# Patient Record
Sex: Female | Born: 1935
Health system: Southern US, Community
[De-identification: ages and names within clinical notes are randomized; demographics above are authoritative.]

## PROBLEM LIST (undated history)

## (undated) DIAGNOSIS — H409 Unspecified glaucoma: Secondary | ICD-10-CM

## (undated) DIAGNOSIS — I10 Essential (primary) hypertension: Secondary | ICD-10-CM

## (undated) DIAGNOSIS — H1013 Acute atopic conjunctivitis, bilateral: Secondary | ICD-10-CM

## (undated) DIAGNOSIS — M8580 Other specified disorders of bone density and structure, unspecified site: Secondary | ICD-10-CM

## (undated) DIAGNOSIS — E559 Vitamin D deficiency, unspecified: Secondary | ICD-10-CM

## (undated) DIAGNOSIS — K635 Polyp of colon: Secondary | ICD-10-CM

## (undated) DIAGNOSIS — J309 Allergic rhinitis, unspecified: Secondary | ICD-10-CM

## (undated) DIAGNOSIS — E785 Hyperlipidemia, unspecified: Secondary | ICD-10-CM

## (undated) HISTORY — DX: Essential (primary) hypertension: I10

## (undated) HISTORY — DX: Other specified disorders of bone density and structure, unspecified site: M85.80

## (undated) HISTORY — DX: Vitamin D deficiency, unspecified: E55.9

## (undated) HISTORY — DX: Hyperlipidemia, unspecified: E78.5

## (undated) HISTORY — DX: Acute atopic conjunctivitis, bilateral: H10.13

## (undated) HISTORY — DX: Polyp of colon: K63.5

## (undated) HISTORY — DX: Allergic rhinitis, unspecified: J30.9

## (undated) HISTORY — PX: FOOT SURGERY: SHX648

## (undated) HISTORY — DX: Unspecified glaucoma: H40.9

---

## 2000-08-27 ENCOUNTER — Other Ambulatory Visit: Admission: RE | Admit: 2000-08-27 | Discharge: 2000-08-27 | Payer: Self-pay | Admitting: General Surgery

## 2002-01-14 ENCOUNTER — Encounter (INDEPENDENT_AMBULATORY_CARE_PROVIDER_SITE_OTHER): Payer: Self-pay

## 2002-01-14 ENCOUNTER — Ambulatory Visit (HOSPITAL_COMMUNITY): Admission: RE | Admit: 2002-01-14 | Discharge: 2002-01-14 | Payer: Self-pay | Admitting: Gastroenterology

## 2002-11-12 ENCOUNTER — Other Ambulatory Visit: Admission: RE | Admit: 2002-11-12 | Discharge: 2002-11-12 | Payer: Self-pay | Admitting: *Deleted

## 2004-11-21 ENCOUNTER — Other Ambulatory Visit: Admission: RE | Admit: 2004-11-21 | Discharge: 2004-11-21 | Payer: Self-pay | Admitting: *Deleted

## 2005-11-13 HISTORY — PX: CATARACT EXTRACTION: SUR2

## 2006-07-03 ENCOUNTER — Ambulatory Visit (HOSPITAL_COMMUNITY): Admission: RE | Admit: 2006-07-03 | Discharge: 2006-07-03 | Payer: Self-pay | Admitting: Gastroenterology

## 2006-07-12 ENCOUNTER — Ambulatory Visit (HOSPITAL_COMMUNITY): Admission: RE | Admit: 2006-07-12 | Discharge: 2006-07-13 | Payer: Self-pay | Admitting: Ophthalmology

## 2006-07-12 ENCOUNTER — Encounter (INDEPENDENT_AMBULATORY_CARE_PROVIDER_SITE_OTHER): Payer: Self-pay | Admitting: *Deleted

## 2006-12-18 ENCOUNTER — Other Ambulatory Visit: Admission: RE | Admit: 2006-12-18 | Discharge: 2006-12-18 | Payer: Self-pay | Admitting: *Deleted

## 2009-12-30 ENCOUNTER — Other Ambulatory Visit: Admission: RE | Admit: 2009-12-30 | Discharge: 2009-12-30 | Payer: Self-pay | Admitting: Family Medicine

## 2011-03-31 NOTE — Op Note (Signed)
NAMEETNA, FORQUER NO.:  [id number]   MEDICAL RECORD NO.:  [id number]          PATIENT TYPE:  AMB   LOCATION:  ENDO                         FACILITY:  [facility]   PHYSICIAN:  Petra Kuba, M.D.    DATE OF BIRTH:  03/16/1933   DATE OF PROCEDURE:  07/03/2006  DATE OF DISCHARGE:                                 OPERATIVE REPORT   PROCEDURE:  Colonoscopy.   INDICATIONS:  History of colon polyps, due for colonic screening.  Consent  was signed after risks, benefits, and  options thoroughly discussed multiple  times in the past.   MEDICINES USED:  Fentanyl 50 mcg, Versed 5 mg.   PROCEDURE:  Rectal inspection is pertinent for external hemorrhoids, small.  Digital exam was negative.  Video pediatric adjustable colonoscope was  inserted and, despite a long looping tortuous colon, with lots of advancing  and withdrawals, we are able to easily advance around the colon to the  cecum.  This did not require any abdominal pressure, any position changes.  No abnormality was seen on insertion.  Cecum is identified by the  appendiceal orifice and ileocecal valve.  The scope was slowly withdrawn.  The prep was adequate.  There was minimal liquid stool that required washing  and suctioning.  On slow withdrawal through the colon, no abnormalities were  seen, specifically no polyps, tumors, masses or diverticula.  Once back in  the rectum, anorectal pull through and retroflexion confirms some small  hemorrhoids.  Scope was straightened and readvanced to the left side of the  colon.  Air was suctioned, scope removed.  The patient tolerated the  procedure well.  There was no obvious immediate complication.   ENDOSCOPIC DIAGNOSES:  1. Internal and external small hemorrhoids.  2. Otherwise within normal limits to the cecum.   PLAN:  Happy to see back p.r.n.  Repeat screening in 5 years if doing well  medically.  Return care to Dr. Janey Greaser.            ______________________________  Petra Kuba, M.D.     MEM/MEDQ  D:  07/03/2006  T:  07/03/2006  Job:  [id number]   cc:   Al Decant. Janey Greaser, MD

## 2011-03-31 NOTE — Op Note (Signed)
Brittany Burgess, Brittany Burgess NO.:  [id number]   MEDICAL RECORD NO.:  [id number]          PATIENT TYPE:  OIB   LOCATION:  5730                         FACILITY:  [facility]   PHYSICIAN:  Beulah Gandy. Ashley Royalty, M.D. DATE OF BIRTH:  [birth year]   DATE OF PROCEDURE:  07/12/2006  DATE OF DISCHARGE:  07/13/2006                                 OPERATIVE REPORT   ADMISSION DIAGNOSES:  1. Retained lens material.  2. Phacolytic glaucoma.  3. Dislocated intraocular lens in the right eye.   PROCEDURES:  Pars plana vitrectomy with 25-gauge system, pan retinal  photocoagulation, removal of intraocular lens from vitreous right eye,  placement of secondary intraocular lens right eye, gas fluid exchange,  membrane peel.   SURGEON:  Alan Mulder, M.D.   ASSISTANT:  Rosalie Doctor, MA.   ANESTHESIA:  General.   DETAILS:  Usual prep and drape, conjunctival peritomy from 8 o'clock around  to 4 o'clock.  The cornea was cloudy and almost white this point.  Scleral  flaps were raised at 3 and 9 o'clock in anticipation of IOL suture.  Corneoscleral wound was created from 10 o'clock to 2 o'clock 7 mm in length  and three layer shape.  The 25-gauge trocars were placed at 8, 10 and 2  o'clock.  Infusion port was inserted at 8 o'clock.  The pressure was brought  down to 20 mmHg.  The cornea began to clear at this point.  Provisc was  placed on the corneal surface and the BIOM viewing system was moved into  place.  Pars plana vitrectomy was performed removing large quantities of  vitreous and the lens material from the vitreous cavity.  Small chunks and  large chunks were also seen, yellow femoral flakes of nucleus were  encountered and carefully removed under low suction and rapid cutting.  Scleral depression was used to gain access to the vitreous base where  additional clumps of white fluffy material were encountered and removed.  Membranes membranes were stripped from the periphery where lens  material was  entangled and ensnared in the membranes.  The corneoscleral wound was opened  and the cutter was placed in the anterior chamber removing all blood and  lens material from the anterior chamber area.  The intraocular lens which  was partially in the vitreous was grasped with 25-gauge poor forceps and  passed through the pupil into the anterior chamber and out through the  corneoscleral wound.  Two Prolene 10-0 nylon sutures were placed from 3  o'clock to 9 o'clock beneath the scleral flaps.  The sutures were  externalized.  The new intraocular lens made by [employer]  model CZ70BD, power 10 D, length 12.5 mm, optic 7.0 mm, serial number  [id number] was brought onto the field.  It was inspected and cleaned.  The  sutures were attached to the eyelets of the lens.  The lens was passed  through the corneoscleral wound through the pupil and into the posterior  chamber into the ciliary sulcus.  The lens was dialed into place and the  Prolene sutures were drawn securely.  They  were knotted beneath the scleral  flaps and the flaps were pressed down over the knots.  The corneal wound was  closed with seven interrupted 10-0 nylon sutures.  The wound was tested and  found to be tight.  Additional vitrectomy was carried out at this point,  removing blood from the vitreous cavity as well as additional lens  fragments.  A partial gas fluid exchange was performed which freed  additional lens fragments into the vitreous.  The left vitreous cavity was  filled again with fluid and each lens remnant was removed.  A 30% gas fill  was performed.  The instruments were removed from the eye.  The 25-gauge  trocars were removed.  The wounds were tested and were held until tight.  The conjunctiva was reposited with 7-0 chromic suture.  Polymyxin and  gentamicin were irrigated into tenon space.  Marcaine was injected around  the globe for postop pain.  TobraDex ophthalmic ointment was  placed.  Closing pressure was 10 with a Barraquer tonometer.  The TobraDex ophthalmic  ointment, patch and shield were placed.   The patient was taken to recovery in satisfactory condition.   COMPLICATIONS:  None.   OPERATIVE TIME:  One and a half hours.      Beulah Gandy. Ashley Royalty, M.D.  Electronically Signed     JDM/MEDQ  D:  07/12/2006  T:  07/13/2006  Job:  [id number]

## 2013-12-19 ENCOUNTER — Ambulatory Visit: Payer: Self-pay | Admitting: Podiatrist

## 2014-02-04 ENCOUNTER — Ambulatory Visit: Payer: Self-pay | Admitting: Internal Medicine

## 2014-03-03 ENCOUNTER — Encounter: Payer: Self-pay | Admitting: Internal Medicine

## 2014-03-03 ENCOUNTER — Other Ambulatory Visit (INDEPENDENT_AMBULATORY_CARE_PROVIDER_SITE_OTHER): Payer: Managed Care, Other (non HMO)

## 2014-03-03 ENCOUNTER — Ambulatory Visit (INDEPENDENT_AMBULATORY_CARE_PROVIDER_SITE_OTHER): Payer: Managed Care, Other (non HMO) | Admitting: Internal Medicine

## 2014-03-03 VITALS — BP 138/88 | HR 58 | Temp 97.6°F | Ht 62.0 in | Wt 125.0 lb

## 2014-03-03 DIAGNOSIS — M8580 Other specified disorders of bone density and structure, unspecified site: Secondary | ICD-10-CM | POA: Insufficient documentation

## 2014-03-03 DIAGNOSIS — E559 Vitamin D deficiency, unspecified: Secondary | ICD-10-CM | POA: Insufficient documentation

## 2014-03-03 DIAGNOSIS — H409 Unspecified glaucoma: Secondary | ICD-10-CM | POA: Insufficient documentation

## 2014-03-03 DIAGNOSIS — I10 Essential (primary) hypertension: Secondary | ICD-10-CM | POA: Insufficient documentation

## 2014-03-03 DIAGNOSIS — K635 Polyp of colon: Secondary | ICD-10-CM | POA: Insufficient documentation

## 2014-03-03 DIAGNOSIS — Z0001 Encounter for general adult medical examination with abnormal findings: Secondary | ICD-10-CM | POA: Insufficient documentation

## 2014-03-03 DIAGNOSIS — J309 Allergic rhinitis, unspecified: Secondary | ICD-10-CM | POA: Insufficient documentation

## 2014-03-03 DIAGNOSIS — Z0000 Encounter for general adult medical examination without abnormal findings: Secondary | ICD-10-CM

## 2014-03-03 LAB — BASIC METABOLIC PANEL
BUN: 17 mg/dL (ref 6–23)
CALCIUM: 9.8 mg/dL (ref 8.4–10.5)
CO2: 28 meq/L (ref 19–32)
CREATININE: 0.6 mg/dL (ref 0.4–1.2)
Chloride: 107 mEq/L (ref 96–112)
GFR: 98.71 mL/min (ref >60.00)
GLUCOSE: 85 mg/dL (ref 70–99)
Potassium: 3.9 mEq/L (ref 3.5–5.1)
SODIUM: 142 meq/L (ref 135–145)

## 2014-03-03 LAB — HEPATIC FUNCTION PANEL
ALT: 15 U/L (ref 0–35)
AST: 20 U/L (ref 0–37)
Albumin: 4.3 g/dL (ref 3.5–5.2)
Alkaline Phosphatase: 50 U/L (ref 39–117)
BILIRUBIN DIRECT: 0 mg/dL (ref 0.0–0.3)
BILIRUBIN TOTAL: 0.7 mg/dL (ref 0.3–1.2)
TOTAL PROTEIN: 7.4 g/dL (ref 6.0–8.3)

## 2014-03-03 LAB — URINALYSIS, ROUTINE W REFLEX MICROSCOPIC
BILIRUBIN URINE: NEGATIVE
HGB URINE DIPSTICK: NEGATIVE
KETONES UR: NEGATIVE
Leukocytes, UA: NEGATIVE
Nitrite: NEGATIVE
PH: 6 (ref 5.0–8.0)
Specific Gravity, Urine: 1.02 (ref 1.000–1.030)
Total Protein, Urine: NEGATIVE
URINE GLUCOSE: NEGATIVE
UROBILINOGEN UA: 0.2 (ref 0.0–1.0)

## 2014-03-03 LAB — LIPID PANEL
Cholesterol: 139 mg/dL (ref 0–200)
HDL: 57.3 mg/dL (ref >39.00)
LDL Cholesterol: 65 mg/dL (ref 0–99)
Total CHOL/HDL Ratio: 2
Triglycerides: 85 mg/dL (ref 0.0–149.0)
VLDL: 17 mg/dL (ref 0.0–40.0)

## 2014-03-03 LAB — CBC WITH DIFFERENTIAL/PLATELET
BASOS ABS: 0 10*3/uL (ref 0.0–0.1)
Basophils Relative: 0.4 % (ref 0.0–3.0)
EOS ABS: 0.1 10*3/uL (ref 0.0–0.7)
EOS PCT: 1.6 % (ref 0.0–5.0)
HCT: 42.7 % (ref 36.0–46.0)
HEMOGLOBIN: 14.2 g/dL (ref 12.0–15.0)
Lymphocytes Relative: 41.1 % (ref 12.0–46.0)
Lymphs Abs: 2.8 10*3/uL (ref 0.7–4.0)
MCHC: 33.3 g/dL (ref 30.0–36.0)
MCV: 91.3 fl (ref 78.0–100.0)
MONOS PCT: 8.5 % (ref 3.0–12.0)
Monocytes Absolute: 0.6 10*3/uL (ref 0.1–1.0)
NEUTROS ABS: 3.3 10*3/uL (ref 1.4–7.7)
Neutrophils Relative %: 48.4 % (ref 43.0–77.0)
PLATELETS: 251 10*3/uL (ref 150.0–400.0)
RBC: 4.68 Mil/uL (ref 3.87–5.11)
RDW: 13.1 % (ref 11.5–14.6)
WBC: 6.9 10*3/uL (ref 4.5–10.5)

## 2014-03-03 LAB — TSH: TSH: 2.54 u[IU]/mL (ref 0.35–5.50)

## 2014-03-03 NOTE — Assessment & Plan Note (Signed)

## 2014-03-03 NOTE — Assessment & Plan Note (Signed)
stable overall by history and exam, recent data reviewed with pt, and pt to continue medical treatment as before,  to f/u any worsening symptoms or concerns BP Readings from Last 3 Encounters:  03/03/14 138/88

## 2014-03-03 NOTE — Progress Notes (Signed)
Subjective:    Patient ID: Brittany Burgess, female    DOB: 28-Jun-1933, 80 y.o.   MRN: [id number]  HPI  Here for wellness and f/u;  Overall doing ok;  Pt denies CP, worsening SOB, DOE, wheezing, orthopnea, PND, worsening LE edema, palpitations, dizziness or syncope.  Pt denies neurological change such as new headache, facial or extremity weakness.  Pt denies polydipsia, polyuria, or low sugar symptoms. Pt states overall good compliance with treatment and medications, good tolerability, and has been trying to follow lower cholesterol diet.  Pt denies worsening depressive symptoms, suicidal ideation or panic. No fever, night sweats, wt loss, loss of appetite, or other constitutional symptoms.  Pt states good ability with ADL's, has low fall risk, home safety reviewed and adequate, no other significant changes in hearing or vision, and only occasionally active with exercise. Due for mammogram.  No current complaints Past Medical History  Diagnosis Date  . Glaucoma   . Hypertension   . Colon polyps   . Allergic rhinitis   . Allergic conjunctivitis of both eyes   . Osteopenia   . Vitamin D deficiency    Past Surgical History  Procedure Laterality Date  . Foot surgery    . Cataract extraction  2007    reports that she has never smoked. She has never used smokeless tobacco. She reports that she does not drink alcohol or use illicit drugs. family history includes Cancer in her father and mother; Diabetes in her father and mother. Allergies  Allergen Reactions  . Combigan [Brimonidine Tartrate-Timolol]     Red eyelids    No current outpatient prescriptions on file prior to visit.   No current facility-administered medications on file prior to visit.     Review of Systems Constitutional: Negative for diaphoresis, activity change, appetite change or unexpected weight change.  HENT: Negative for hearing loss, ear pain, facial swelling, mouth sores and neck stiffness.   Eyes: Negative for  pain, redness and visual disturbance.  Respiratory: Negative for shortness of breath and wheezing.   Cardiovascular: Negative for chest pain and palpitations.  Gastrointestinal: Negative for diarrhea, blood in stool, abdominal distention or other pain Genitourinary: Negative for hematuria, flank pain or change in urine volume.  Musculoskeletal: Negative for myalgias and joint swelling.  Skin: Negative for color change and wound.  Neurological: Negative for syncope and numbness. other than noted Hematological: Negative for adenopathy.  Psychiatric/Behavioral: Negative for hallucinations, self-injury, decreased concentration and agitation.      Objective:   Physical Exam BP 138/88  Pulse 58  Temp(Src) 97.6 F (36.4 C) (Oral)  Ht 5\' 2"  (1.575 m)  Wt 125 lb (56.7 kg)  BMI 22.86 kg/m2  SpO2 98% VS noted,  Constitutional: Pt is oriented to person, place, and time. Appears well-developed and well-nourished but on thin side.  Head: Normocephalic and atraumatic.  Right Ear: External ear normal.  Left Ear: External ear normal.  Nose: Nose normal.  Mouth/Throat: Oropharynx is clear and moist.  Eyes: Conjunctivae and EOM are normal. Pupils are equal, round, and reactive to light.  Neck: Normal range of motion. Neck supple. No JVD present. No tracheal deviation present.  Cardiovascular: Normal rate, regular rhythm, normal heart sounds and intact distal pulses.   Pulmonary/Chest: Effort normal and breath sounds normal.  Abdominal: Soft. Bowel sounds are normal. There is no tenderness. No HSM  Musculoskeletal: Normal range of motion. Exhibits no edema.  Lymphadenopathy:  Has no cervical adenopathy.  Neurological: Pt is alert and oriented  to person, place, and time. Pt has normal reflexes. No cranial nerve deficit.  Skin: Skin is warm and dry. No rash noted.  Psychiatric:  Has  normal mood and affect. Behavior is normal.     Assessment & Plan:

## 2014-03-03 NOTE — Patient Instructions (Addendum)
Please continue all other medications as before, and refills have been done if requested. Please have the pharmacy call with any other refills you may need.  Please continue your efforts at being more active, low cholesterol diet, and weight control. You are otherwise up to date with prevention measures today.  Please keep your appointments with your specialists as you may have planned  You will be contacted regarding the referral for: mammogram  You can call for a Nurse Visit to have the Prevnar done at any time  Please go to the LAB in the Basement (turn left off the elevator) for the tests to be done today You will be contacted by phone if any changes need to be made immediately.  Otherwise, you will receive a letter about your results with an explanation, but please check with MyChart first.  Please remember to sign up for MyChart if you have not done so, as this will be important to you in the future with finding out test results, communicating by private email, and scheduling acute appointments online when needed.  Please return in 1 year for your yearly visit, or sooner if needed, with Lab testing done 3-5 days before

## 2014-03-04 ENCOUNTER — Telehealth: Payer: Self-pay | Admitting: Internal Medicine

## 2014-03-04 NOTE — Telephone Encounter (Signed)
Relevant patient education mailed to patient.  

## 2014-07-17 ENCOUNTER — Other Ambulatory Visit: Payer: Self-pay | Admitting: Internal Medicine

## 2014-09-28 LAB — HM MAMMOGRAPHY

## 2014-10-06 ENCOUNTER — Encounter: Payer: Self-pay | Admitting: Internal Medicine

## 2015-02-02 ENCOUNTER — Encounter: Payer: Self-pay | Admitting: Internal Medicine

## 2015-02-02 ENCOUNTER — Ambulatory Visit (INDEPENDENT_AMBULATORY_CARE_PROVIDER_SITE_OTHER): Payer: Medicare HMO | Admitting: Internal Medicine

## 2015-02-02 ENCOUNTER — Other Ambulatory Visit (INDEPENDENT_AMBULATORY_CARE_PROVIDER_SITE_OTHER): Payer: Medicare HMO

## 2015-02-02 VITALS — BP 128/86 | HR 80 | Temp 98.1°F | Resp 18 | Ht 62.0 in | Wt 124.1 lb

## 2015-02-02 DIAGNOSIS — M79609 Pain in unspecified limb: Secondary | ICD-10-CM | POA: Insufficient documentation

## 2015-02-02 DIAGNOSIS — I10 Essential (primary) hypertension: Secondary | ICD-10-CM

## 2015-02-02 DIAGNOSIS — R202 Paresthesia of skin: Secondary | ICD-10-CM

## 2015-02-02 DIAGNOSIS — Z0000 Encounter for general adult medical examination without abnormal findings: Secondary | ICD-10-CM | POA: Diagnosis not present

## 2015-02-02 LAB — URINALYSIS, ROUTINE W REFLEX MICROSCOPIC
Bilirubin Urine: NEGATIVE
Ketones, ur: NEGATIVE
Leukocytes, UA: NEGATIVE
Nitrite: NEGATIVE
Specific Gravity, Urine: 1.02 (ref 1.000–1.030)
Total Protein, Urine: NEGATIVE
UROBILINOGEN UA: 0.2 (ref 0.0–1.0)
Urine Glucose: NEGATIVE
pH: 6 (ref 5.0–8.0)

## 2015-02-02 LAB — CBC WITH DIFFERENTIAL/PLATELET
Basophils Absolute: 0 10*3/uL (ref 0.0–0.1)
Basophils Relative: 0.4 % (ref 0.0–3.0)
EOS PCT: 1.3 % (ref 0.0–5.0)
Eosinophils Absolute: 0.1 10*3/uL (ref 0.0–0.7)
HEMATOCRIT: 41.3 % (ref 36.0–46.0)
HEMOGLOBIN: 13.7 g/dL (ref 12.0–15.0)
LYMPHS ABS: 2.2 10*3/uL (ref 0.7–4.0)
Lymphocytes Relative: 37.1 % (ref 12.0–46.0)
MCHC: 33.3 g/dL (ref 30.0–36.0)
MCV: 90.2 fl (ref 78.0–100.0)
MONOS PCT: 9.7 % (ref 3.0–12.0)
Monocytes Absolute: 0.6 10*3/uL (ref 0.1–1.0)
NEUTROS ABS: 3 10*3/uL (ref 1.4–7.7)
Neutrophils Relative %: 51.5 % (ref 43.0–77.0)
Platelets: 253 10*3/uL (ref 150.0–400.0)
RBC: 4.58 Mil/uL (ref 3.87–5.11)
RDW: 13.6 % (ref 11.5–15.5)
WBC: 5.9 10*3/uL (ref 4.0–10.5)

## 2015-02-02 LAB — BASIC METABOLIC PANEL
BUN: 12 mg/dL (ref 6–23)
CHLORIDE: 104 meq/L (ref 96–112)
CO2: 31 mEq/L (ref 19–32)
CREATININE: 0.64 mg/dL (ref 0.40–1.20)
Calcium: 9.6 mg/dL (ref 8.4–10.5)
GFR: 94.94 mL/min (ref >60.00)
Glucose, Bld: 91 mg/dL (ref 70–99)
POTASSIUM: 4.3 meq/L (ref 3.5–5.1)
Sodium: 140 mEq/L (ref 135–145)

## 2015-02-02 LAB — HEPATIC FUNCTION PANEL
ALT: 13 U/L (ref 0–35)
AST: 20 U/L (ref 0–37)
Albumin: 4.2 g/dL (ref 3.5–5.2)
Alkaline Phosphatase: 51 U/L (ref 39–117)
BILIRUBIN TOTAL: 0.4 mg/dL (ref 0.2–1.2)
Bilirubin, Direct: 0.1 mg/dL (ref 0.0–0.3)
Total Protein: 7.1 g/dL (ref 6.0–8.3)

## 2015-02-02 LAB — LIPID PANEL
CHOLESTEROL: 146 mg/dL (ref 0–200)
HDL: 56.5 mg/dL (ref >39.00)
LDL CALC: 64 mg/dL (ref 0–99)
NonHDL: 89.5
Total CHOL/HDL Ratio: 3
Triglycerides: 130 mg/dL (ref 0.0–149.0)
VLDL: 26 mg/dL (ref 0.0–40.0)

## 2015-02-02 LAB — TSH: TSH: 3.14 u[IU]/mL (ref 0.35–4.50)

## 2015-02-02 NOTE — Assessment & Plan Note (Signed)
C/w prob mild right CTS, for right wrist splint qhs prn, tylenol prnb, .followk

## 2015-02-02 NOTE — Progress Notes (Signed)
Subjective:    Patient ID: Brittany Burgess, female    DOB: [birth year], 81 y.o.   MRN: [id number]  HPI  Here for wellness and f/u;  Overall doing ok;  Pt denies Chest pain, worsening SOB, DOE, wheezing, orthopnea, PND, worsening LE edema, palpitations, dizziness or syncope.  Pt denies neurological change such as new headache, facial or extremity weakness.  Pt denies polydipsia, polyuria, or low sugar symptoms. Pt states overall good compliance with treatment and medications, good tolerability, and has been trying to follow appropriate diet.  Pt denies worsening depressive symptoms, suicidal ideation or panic. No fever, night sweats, wt loss, loss of appetite, or other constitutional symptoms.  Pt states good ability with ADL's, has low fall risk, home safety reviewed and adequate, no other significant changes in hearing or vision, and only occasionally active with exercise. Decliens pna shot, thinks she had this previously, details not clear. C/o mild intermittent right wrist discomfort and funnys numb/tingling sensation to medial right thumb and first finger Past Medical History  Diagnosis Date  . Glaucoma   . Hypertension   . Colon polyps   . Allergic rhinitis   . Allergic conjunctivitis of both eyes   . Osteopenia   . Vitamin D deficiency    Past Surgical History  Procedure Laterality Date  . Foot surgery    . Cataract extraction  2007    reports that she has never smoked. She has never used smokeless tobacco. She reports that she does not drink alcohol or use illicit drugs. family history includes Cancer in her father and mother; Diabetes in her father and mother. Allergies  Allergen Reactions  . Combigan [Brimonidine Tartrate-Timolol]     Red eyelids    Current Outpatient Prescriptions on File Prior to Visit  Medication Sig Dispense Refill  . lovastatin (MEVACOR) 40 MG tablet TAKE 1 TABLET ONCE A DAY BY MOUTH 90 tablet 3  . Multiple Vitamins-Minerals (CENTRUM SILVER PO) Take 1  tablet by mouth daily.    . travoprost, benzalkonium, (TRAVATAN) 0.004 % ophthalmic solution 1 drop at bedtime.    Marland Kitchen aspirin 81 MG tablet Take 81 mg by mouth daily.    . Brinzolamide-Brimonidine [facility] OP) Apply to eye 3 (three) times daily.    . Carboxymethylcellul-Glycerin (OPTIVE) 0.5-0.9 % SOLN Apply to eye as needed.     No current facility-administered medications on file prior to visit.   Review of Systems Constitutional: Negative for increased diaphoresis, other activity, appetite or siginficant weight change other than noted HENT: Negative for worsening hearing loss, ear pain, facial swelling, mouth sores and neck stiffness.   Eyes: Negative for other worsening pain, redness or visual disturbance.  Respiratory: Negative for shortness of breath and wheezing  Cardiovascular: Negative for chest pain and palpitations.  Gastrointestinal: Negative for diarrhea, blood in stool, abdominal distention or other pain Genitourinary: Negative for hematuria, flank pain or change in urine volume.  Musculoskeletal: Negative for myalgias or other joint complaints.  Skin: Negative for color change and wound or drainage.  Neurological: Negative for syncope and numbness. other than noted Hematological: Negative for adenopathy. or other swelling Psychiatric/Behavioral: Negative for hallucinations, SI, self-injury, decreased concentration or other worsening agitation.      Objective:   Physical Exam BP 128/86 mmHg  Pulse 80  Temp(Src) 98.1 F (36.7 C) (Oral)  Resp 18  Ht 5\' 2"  (1.575 m)  Wt 124 lb 1.9 oz (56.3 kg)  BMI 22.70 kg/m2  SpO2 99% VS noted,  Constitutional: Pt  is oriented to person, place, and time. Appears well-developed and well-nourished, in no significant distress Head: Normocephalic and atraumatic.  Right Ear: External ear normal.  Left Ear: External ear normal.  Nose: Nose normal.  Mouth/Throat: Oropharynx is clear and moist.  Eyes: Conjunctivae and EOM are normal.  Pupils are equal, round, and reactive to light.  Neck: Normal range of motion. Neck supple. No JVD present. No tracheal deviation present or significant neck LA or mass Cardiovascular: Normal rate, regular rhythm, normal heart sounds and intact distal pulses.   Pulmonary/Chest: Effort normal and breath sounds without rales or wheezing  Abdominal: Soft. Bowel sounds are normal. NT. No HSM  Musculoskeletal: Normal range of motion. Exhibits no edema.  Lymphadenopathy:  Has no cervical adenopathy.  Neurological: Pt is alert and oriented to person, place, and time. Pt has normal reflexes. No cranial nerve deficit. Motor grossly intact Skin: Skin is warm and dry. No rash noted.  Psychiatric:  Has normal mood and affect. Behavior is normal.      Assessment & Plan:

## 2015-02-02 NOTE — Progress Notes (Signed)
Pre visit review using our clinic review tool, if applicable. No additional management support is needed unless otherwise documented below in the visit note. 

## 2015-02-02 NOTE — Patient Instructions (Signed)
Please wear a right wrist splint at night to help the right thumb and wrist symptoms until improved  Please continue all other medications as before, and refills have been done if requested.  Please have the pharmacy call with any other refills you may need.  Please continue your efforts at being more active, low cholesterol diet, and weight control.  You are otherwise up to date with prevention measures today.  Please keep your appointments with your specialists as you may have planned  Please go to the LAB in the Basement (turn left off the elevator) for the tests to be done today  You will be contacted by phone if any changes need to be made immediately.  Otherwise, you will receive a letter about your results with an explanation, but please check with MyChart first.  Please remember to sign up for MyChart if you have not done so, as this will be important to you in the future with finding out test results, communicating by private email, and scheduling acute appointments online when needed.  Please return in 1 year for your yearly visit, or sooner if needed, with Lab testing done 3-5 days before

## 2015-02-02 NOTE — Assessment & Plan Note (Signed)

## 2015-07-16 ENCOUNTER — Other Ambulatory Visit: Payer: Self-pay | Admitting: Internal Medicine

## 2015-09-15 DIAGNOSIS — Z23 Encounter for immunization: Secondary | ICD-10-CM | POA: Diagnosis not present

## 2015-10-12 DIAGNOSIS — Z803 Family history of malignant neoplasm of breast: Secondary | ICD-10-CM | POA: Diagnosis not present

## 2015-10-12 DIAGNOSIS — Z1231 Encounter for screening mammogram for malignant neoplasm of breast: Secondary | ICD-10-CM | POA: Diagnosis not present

## 2015-10-12 LAB — HM MAMMOGRAPHY

## 2015-10-15 ENCOUNTER — Encounter: Payer: Self-pay | Admitting: Internal Medicine

## 2015-10-15 ENCOUNTER — Other Ambulatory Visit: Payer: Self-pay | Admitting: Internal Medicine

## 2015-10-28 DIAGNOSIS — H40013 Open angle with borderline findings, low risk, bilateral: Secondary | ICD-10-CM | POA: Diagnosis not present

## 2015-12-08 ENCOUNTER — Other Ambulatory Visit (INDEPENDENT_AMBULATORY_CARE_PROVIDER_SITE_OTHER): Payer: Medicare HMO

## 2015-12-08 ENCOUNTER — Encounter: Payer: Self-pay | Admitting: Internal Medicine

## 2015-12-08 ENCOUNTER — Ambulatory Visit (INDEPENDENT_AMBULATORY_CARE_PROVIDER_SITE_OTHER): Payer: Medicare HMO | Admitting: Internal Medicine

## 2015-12-08 ENCOUNTER — Ambulatory Visit (INDEPENDENT_AMBULATORY_CARE_PROVIDER_SITE_OTHER)
Admission: RE | Admit: 2015-12-08 | Discharge: 2015-12-08 | Disposition: A | Discharge status: Home / Self Care | Payer: Medicare HMO | Source: Ambulatory Visit | Attending: Internal Medicine | Admitting: Internal Medicine

## 2015-12-08 VITALS — BP 120/74 | HR 75 | Temp 98.3°F | Resp 20 | Wt 125.2 lb

## 2015-12-08 DIAGNOSIS — Z23 Encounter for immunization: Secondary | ICD-10-CM | POA: Diagnosis not present

## 2015-12-08 DIAGNOSIS — M545 Low back pain, unspecified: Secondary | ICD-10-CM | POA: Insufficient documentation

## 2015-12-08 DIAGNOSIS — I10 Essential (primary) hypertension: Secondary | ICD-10-CM

## 2015-12-08 DIAGNOSIS — Z0000 Encounter for general adult medical examination without abnormal findings: Secondary | ICD-10-CM

## 2015-12-08 DIAGNOSIS — S3992XA Unspecified injury of lower back, initial encounter: Secondary | ICD-10-CM | POA: Diagnosis not present

## 2015-12-08 LAB — BASIC METABOLIC PANEL
BUN: 16 mg/dL (ref 6–23)
CO2: 30 meq/L (ref 19–32)
Calcium: 9.3 mg/dL (ref 8.4–10.5)
Chloride: 104 mEq/L (ref 96–112)
Creatinine, Ser: 0.61 mg/dL (ref 0.40–1.20)
GFR: 100.13 mL/min (ref >60.00)
GLUCOSE: 113 mg/dL — AB (ref 70–99)
POTASSIUM: 3.7 meq/L (ref 3.5–5.1)
SODIUM: 142 meq/L (ref 135–145)

## 2015-12-08 LAB — HEPATIC FUNCTION PANEL
ALBUMIN: 4.3 g/dL (ref 3.5–5.2)
ALT: 11 U/L (ref 0–35)
AST: 16 U/L (ref 0–37)
Alkaline Phosphatase: 61 U/L (ref 39–117)
BILIRUBIN TOTAL: 0.3 mg/dL (ref 0.2–1.2)
Bilirubin, Direct: 0.1 mg/dL (ref 0.0–0.3)
Total Protein: 7.3 g/dL (ref 6.0–8.3)

## 2015-12-08 LAB — URINALYSIS, ROUTINE W REFLEX MICROSCOPIC
Bilirubin Urine: NEGATIVE
HGB URINE DIPSTICK: NEGATIVE
Ketones, ur: NEGATIVE
LEUKOCYTES UA: NEGATIVE
Nitrite: NEGATIVE
RBC / HPF: NONE SEEN (ref 0–?)
Total Protein, Urine: NEGATIVE
URINE GLUCOSE: NEGATIVE
Urobilinogen, UA: 0.2 (ref 0.0–1.0)
WBC UA: NONE SEEN (ref 0–?)
pH: 5.5 (ref 5.0–8.0)

## 2015-12-08 LAB — LIPID PANEL
CHOLESTEROL: 132 mg/dL (ref 0–200)
HDL: 61.1 mg/dL (ref >39.00)
LDL Cholesterol: 48 mg/dL (ref 0–99)
NONHDL: 71.3
Total CHOL/HDL Ratio: 2
Triglycerides: 116 mg/dL (ref 0.0–149.0)
VLDL: 23.2 mg/dL (ref 0.0–40.0)

## 2015-12-08 LAB — CBC WITH DIFFERENTIAL/PLATELET
BASOS PCT: 0.5 % (ref 0.0–3.0)
Basophils Absolute: 0 10*3/uL (ref 0.0–0.1)
EOS PCT: 1.1 % (ref 0.0–5.0)
Eosinophils Absolute: 0.1 10*3/uL (ref 0.0–0.7)
HEMATOCRIT: 42.9 % (ref 36.0–46.0)
HEMOGLOBIN: 14.2 g/dL (ref 12.0–15.0)
LYMPHS PCT: 42.9 % (ref 12.0–46.0)
Lymphs Abs: 3 10*3/uL (ref 0.7–4.0)
MCHC: 33 g/dL (ref 30.0–36.0)
MCV: 90.7 fl (ref 78.0–100.0)
MONO ABS: 0.7 10*3/uL (ref 0.1–1.0)
MONOS PCT: 10 % (ref 3.0–12.0)
Neutro Abs: 3.2 10*3/uL (ref 1.4–7.7)
Neutrophils Relative %: 45.5 % (ref 43.0–77.0)
Platelets: 285 10*3/uL (ref 150.0–400.0)
RBC: 4.74 Mil/uL (ref 3.87–5.11)
RDW: 13.8 % (ref 11.5–15.5)
WBC: 7 10*3/uL (ref 4.0–10.5)

## 2015-12-08 LAB — TSH: TSH: 3.3 u[IU]/mL (ref 0.35–4.50)

## 2015-12-08 NOTE — Assessment & Plan Note (Signed)
C/w liikely contusion vs fx  - for plain films r/o fx, tylenol prn,  to f/u any worsening symptoms or concerns

## 2015-12-08 NOTE — Progress Notes (Signed)
Subjective:   Brittany Burgess is a 82 y.o. female who presents for Medicare Annual (Subsequent) preventive examination.  Review of Systems:  HRA assessment completed during visit;   The Patient was informed that this wellness visit is to identify risk and educate on how to reduce risk for increase disease through lifestyle changes.   ROS deferred to CPE exam with physician for fall Cleaning the deck and slipped and still hurts in low back; very uncomfortable, states pain can be an 8 on a scale of 1-10    Psycho social; mother had DM and cancer; Father the same The patient describes her health has very good;   BMI: 22.9 Diet; tries to have a balanced diet Breakfast; lunch later; eats some vegetables Supper; very light;  Exercise; walking around the home; Recommended using low weights with upper body and walk with weights some for osteopenia   SAFETY one level Safety reviewed for the home;  Removal of clutter clearing paths through the home: completed Railing as needed;  Bathroom safety; both; separate shower if needed  Community safety; yes Smoke detectors; YES Firearms safety; no applicable Driving accidents and seatbelt/ no Sun protection/ if out Stressors; no 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 Jenny Reichmann  Fall assessment: yes but no further falls this year; educated during safety eval Gait assessment/ good normally   Mobilization and Functional losses in the last year/ no Sleep patterns/ fine  Urinary or fecal incontinence reviewed/ no  Counseling: Colonoscopy; probably been 3 years; probably will not have another EKG: 03/03/2014 Hearing: no issues Dexa: [facility] a year or two ago; minor thinning; (takes some vitamins) ([facility] had x 2) [facility] called and is faxing dexa from 2013  [place] /09/2015/ has annually Ophthalmology exam; have to see the doctor q 6 months and treating with eye qtts Immunizations  Due had prevnar today  Current Care Team reviewed and updated    Cardiac Risk Factors include: advanced age (>33men, >66 women)     Objective:     Vitals: BP 120/74 mmHg  Pulse 75  Temp(Src) 98.3 F (36.8 C) (Oral)  Resp 20  Wt 125 lb 4 oz (56.813 kg)  SpO2 97%  Tobacco History  Smoking status  . Never Smoker   Smokeless tobacco  . Never Used     Counseling given: Yes   Past Medical History  Diagnosis Date  . Glaucoma   . Hypertension   . Colon polyps   . Allergic rhinitis   . Allergic conjunctivitis of both eyes   . Osteopenia   . Vitamin D deficiency    Past Surgical History  Procedure Laterality Date  . Foot surgery    . Cataract extraction  2007   Family History  Problem Relation Age of Onset  . Cancer Mother   . Diabetes Mother   . Cancer Father   . Diabetes Father    History  Sexual Activity  . Sexual Activity: Not on file    Outpatient Encounter Prescriptions as of 12/08/2015  Medication Sig  . lovastatin (MEVACOR) 40 MG tablet TAKE ONE TABLET BY MOUTH ONCE DAILY  . travoprost, benzalkonium, (TRAVATAN) 0.004 % ophthalmic solution 1 drop at bedtime.  . [DISCONTINUED] aspirin 81 MG tablet Take 81 mg by mouth daily.  . [DISCONTINUED] Carboxymethylcellul-Glycerin (OPTIVE) 0.5-0.9 % SOLN Apply to eye as needed.  . [DISCONTINUED] Multiple Vitamins-Minerals (CENTRUM SILVER PO) Take 1 tablet by mouth daily.  No facility-administered encounter medications on file as of 12/08/2015.    Activities of Daily Living In your present state of health, do you have any difficulty performing the following activities: 12/08/2015  Hearing? N  Vision? N  Difficulty concentrating or making decisions? N  Walking or climbing stairs? N  Dressing or bathing? N  Doing errands, shopping? N  Preparing Food and eating ? N  Using the Toilet? N  In the past six months, have you accidently leaked urine? N  Do you have problems with loss of bowel control? N  Managing  your Medications? N  Managing your Finances? N  Housekeeping or managing your Housekeeping? N    Patient Care Team: Biagio Borg, MD as PCP - General (Internal Medicine)    Assessment:    Assessment   Patient presents for yearly preventative medicine examination. Medicare questionnaire screening were completed, i.e. Functional;  depression, memory loss and hearing all unremarkable  Did discuss recent fall and stated this was an accident; slipped on wet surface while cleaning porch  All immunizations and health maintenance protocols were reviewed with the patient and rec'd prevnar today  Education provided for laboratory screens; for labs today with xray   Medication reconciliation, past medical history, social history, problem list and allergies were reviewed in detail with the patient  Goals were established with regard weight bearing exercise.   End of life planning was discussed and was not sure if she had a LW; given a copy for [place] HCPOA for her generally information.    Exercise Activities and Dietary recommendations Current Exercise Habits:: Home exercise routine, Type of exercise: walking, Time (Minutes): 20, Frequency (Times/Week): 4, Weekly Exercise (Minutes/Week): 80, Intensity: Mild (busy during the day but walks around home)  Goals    . patient     Remain health and may consider wt bearing exercise       Fall Risk Fall Risk  12/08/2015 02/02/2015 03/03/2014  Falls in the past year? Yes No Yes  Number falls in past yr: 1 - 1  Injury with Fall? Yes - No  Follow up Falls evaluation completed;Education provided - -   Depression Screen PHQ 2/9 Scores 12/08/2015 02/02/2015 03/03/2014  PHQ - 2 Score 0 0 0     Cognitive Testing MMSE - Mini Mental State Exam 12/08/2015  Not completed: (No Data)   AD8 Score 0   Immunization History  Administered Date(s) Administered  . Pneumococcal Conjugate-13 12/08/2015  . Tdap 11/14/2011  . Zoster 11/13/2005    Screening Tests Health Maintenance  Topic Date Due  . DEXA SCAN  04/06/2000  . PNA vac Low Risk Adult (1 of 2 - PCV13) 04/06/2000  . INFLUENZA VACCINE  06/13/2016  . TETANUS/TDAP  11/13/2021  . ZOSTAVAX  Completed      Plan:   Will follow up with [facility] to fax last dexa report.  During the course of the visit the patient was educated and counseled about the following appropriate screening and preventive services:   Vaccines to include Pneumoccal, Influenza, Hepatitis B, Td, Zostavax, HCV  Electrocardiogram/03/03/2014  Cardiovascular Disease/deferred to medical; lipids reviewed   Colorectal cancer screening/ states she was told she will probably not need another;  Bone density screening/ last one was 2013; getting copy   Diabetes screening/ neg  Glaucoma screening/ ongoing bi-annually   Mammography/ 09/2015 neg  Nutrition counseling / briefly discussed; eats appropriate portions;   Patient Instructions (the written plan) was given to the patient.  Wynetta Fines, RN  12/08/2015   Medical screening examination/treatment/procedure(s) were performed by non-physician practitioner and as supervising physician I was immediately available for consultation/collaboration. I agree with above. [name], MD

## 2015-12-08 NOTE — Progress Notes (Signed)
Pre visit review using our clinic review tool, if applicable. No additional management support is needed unless otherwise documented below in the visit note. 

## 2015-12-08 NOTE — Assessment & Plan Note (Signed)
stable overall by history and exam, recent data reviewed with pt, and pt to continue medical treatment as before,  to f/u any worsening symptoms or concerns BP Readings from Last 3 Encounters:  12/08/15 120/74  02/02/15 128/86  03/03/14 138/88  \

## 2015-12-08 NOTE — Progress Notes (Signed)
Subjective:    Patient ID: Brittany Burgess, female    DOB: [birth year], 82 y.o.   MRN: [id number]  HPI  Here for wellness and f/u;  Overall doing ok;  Pt denies Chest pain, worsening SOB, DOE, wheezing, orthopnea, PND, worsening LE edema, palpitations, dizziness or syncope.  Pt denies neurological change such as new headache, facial or extremity weakness.  Pt denies polydipsia, polyuria, or low sugar symptoms. Pt states overall good compliance with treatment and medications, good tolerability, and has been trying to follow appropriate diet.  Pt denies worsening depressive symptoms, suicidal ideation or panic. No fever, night sweats, wt loss, loss of appetite, or other constitutional symptoms.  Pt states good ability with ADL's, has low fall risk, home safety reviewed and adequate, no other significant changes in hearing or vision, and only occasionally active with exercise. Did have an accidental fall to sacrum on hard deck x 1 wk ago, still hurts at least moderate, sometimes severe, does not pain medication, but asks for films to r/o fx.  Last dxa approx 1 yr ago per pt, EMR shows 2013, declines for now Past Medical History  Diagnosis Date  . Glaucoma   . Hypertension   . Colon polyps   . Allergic rhinitis   . Allergic conjunctivitis of both eyes   . Osteopenia   . Vitamin D deficiency    Past Surgical History  Procedure Laterality Date  . Foot surgery    . Cataract extraction  2007    reports that she has never smoked. She has never used smokeless tobacco. She reports that she does not drink alcohol or use illicit drugs. family history includes Cancer in her father and mother; Diabetes in her father and mother. Allergies  Allergen Reactions  . Combigan [Brimonidine Tartrate-Timolol]     Red eyelids    Current Outpatient Prescriptions on File Prior to Visit  Medication Sig Dispense Refill  . lovastatin (MEVACOR) 40 MG tablet TAKE ONE TABLET BY MOUTH ONCE DAILY 90 tablet 0  .  travoprost, benzalkonium, (TRAVATAN) 0.004 % ophthalmic solution 1 drop at bedtime.     No current facility-administered medications on file prior to visit.    Review of Systems  Constitutional: Negative for increased diaphoresis, other activity, appetite or siginficant weight change other than noted HENT: Negative for worsening hearing loss, ear pain, facial swelling, mouth sores and neck stiffness.   Eyes: Negative for other worsening pain, redness or visual disturbance.  Respiratory: Negative for shortness of breath and wheezing  Cardiovascular: Negative for chest pain and palpitations.  Gastrointestinal: Negative for diarrhea, blood in stool, abdominal distention or other pain Genitourinary: Negative for hematuria, flank pain or change in urine volume.  Musculoskeletal: Negative for myalgias or other joint complaints.  Skin: Negative for color change and wound or drainage.  Neurological: Negative for syncope and numbness. other than noted Hematological: Negative for adenopathy. or other swelling Psychiatric/Behavioral: Negative for hallucinations, SI, self-injury, decreased concentration or other worsening agitation.       Objective:   Physical Exam BP 120/74 mmHg  Pulse 75  Temp(Src) 98.3 F (36.8 C) (Oral)  Resp 20  Wt 125 lb 4 oz (56.813 kg)  SpO2 97% VS noted, not ill appearing Constitutional: Pt is oriented to person, place, and time. Appears well-developed and well-nourished, in no significant distress Head: Normocephalic and atraumatic.  Right Ear: External ear normal.  Left Ear: External ear normal.  Nose: Nose normal.  Mouth/Throat: Oropharynx is clear and moist.  Eyes:  Conjunctivae and EOM are normal. Pupils are equal, round, and reactive to light.  Neck: Normal range of motion. Neck supple. No JVD present. No tracheal deviation present or significant neck LA or mass Cardiovascular: Normal rate, regular rhythm, normal heart sounds and intact distal pulses.     Pulmonary/Chest: Effort normal and breath sounds without rales or wheezing  Abdominal: Soft. Bowel sounds are normal. NT. No HSM  Musculoskeletal: Normal range of motion. Exhibits no edema.  Lymphadenopathy:  Has no cervical adenopathy.  Neurological: Pt is alert and oriented to person, place, and time. Pt has normal reflexes. No cranial nerve deficit. Motor grossly intact Spine nontender Sacral area with mod tender, no swelling or rash Skin: Skin is warm and dry. No rash noted.  Psychiatric:  Has normal mood and affect. Behavior is normal.     Assessment & Plan:

## 2015-12-08 NOTE — Patient Instructions (Addendum)
  Brittany Burgess , Thank you for taking time to come for your Medicare Wellness Visit. I appreciate your ongoing commitment to your health goals. Please review the following plan we discussed and let me know if I can assist you in the future.   Health nurse Manuela Schwartz) will call [facility] to get last dexa/called and last dexa was 2013 and faxing report to [phone]   These are the goals we discussed: Goals    . patient     Remain health and may consider wt bearing exercise        This is a list of the screening recommended for you and due dates:  Health Maintenance  Topic Date Due  . DEXA scan (bone density measurement)  04/06/2000  . Pneumonia vaccines (1 of 2 - PCV13) 04/06/2000  . Flu Shot  06/13/2016  . Tetanus Vaccine  11/13/2021  . Shingles Vaccine  Completed      You had the Prevnar pneumonia shot today  You may wish to see Manuela Schwartz for your Annual Medicare Wellness exam today  Please continue all other medications as before, and refills have been done if requested.  Please have the pharmacy call with any other refills you may need.  Please continue your efforts at being more active, low cholesterol diet, and weight control.  You are otherwise up to date with prevention measures today.  Please keep your appointments with your specialists as you may have planned  Please go to the XRAY Department in the Basement (go straight as you get off the elevator) for the x-ray testing  Please go to the LAB in the Basement (turn left off the elevator) for the tests to be done today  You will be contacted by phone if any changes need to be made immediately.  Otherwise, you will receive a letter about your results with an explanation, but please check with MyChart first.  Please remember to sign up for MyChart if you have not done so, as this will be important to you in the future with finding out test results, communicating by private email, and scheduling acute appointments online when  needed.  Please return in 1 year for your yearly visit, or sooner if needed, with Lab testing done 3-5 days before

## 2015-12-08 NOTE — Assessment & Plan Note (Signed)

## 2016-01-13 ENCOUNTER — Other Ambulatory Visit: Payer: Self-pay | Admitting: Internal Medicine

## 2016-03-14 DIAGNOSIS — R69 Illness, unspecified: Secondary | ICD-10-CM | POA: Diagnosis not present

## 2016-04-10 ENCOUNTER — Other Ambulatory Visit: Payer: Self-pay | Admitting: Internal Medicine

## 2016-04-26 DIAGNOSIS — L820 Inflamed seborrheic keratosis: Secondary | ICD-10-CM | POA: Diagnosis not present

## 2016-04-26 DIAGNOSIS — D225 Melanocytic nevi of trunk: Secondary | ICD-10-CM | POA: Diagnosis not present

## 2016-05-11 DIAGNOSIS — H524 Presbyopia: Secondary | ICD-10-CM | POA: Diagnosis not present

## 2016-05-11 DIAGNOSIS — H40013 Open angle with borderline findings, low risk, bilateral: Secondary | ICD-10-CM | POA: Diagnosis not present

## 2016-07-10 ENCOUNTER — Other Ambulatory Visit: Payer: Self-pay | Admitting: Internal Medicine

## 2016-07-27 DIAGNOSIS — R69 Illness, unspecified: Secondary | ICD-10-CM | POA: Diagnosis not present

## 2016-08-30 DIAGNOSIS — E785 Hyperlipidemia, unspecified: Secondary | ICD-10-CM | POA: Diagnosis not present

## 2016-08-30 DIAGNOSIS — Z0000 Encounter for general adult medical examination without abnormal findings: Secondary | ICD-10-CM | POA: Diagnosis not present

## 2016-11-16 DIAGNOSIS — H40013 Open angle with borderline findings, low risk, bilateral: Secondary | ICD-10-CM | POA: Diagnosis not present

## 2016-11-16 DIAGNOSIS — H524 Presbyopia: Secondary | ICD-10-CM | POA: Diagnosis not present

## 2017-01-04 DIAGNOSIS — R69 Illness, unspecified: Secondary | ICD-10-CM | POA: Diagnosis not present

## 2017-01-08 ENCOUNTER — Telehealth: Payer: Self-pay | Admitting: *Deleted

## 2017-01-08 MED ORDER — LOVASTATIN 40 MG PO TABS: 40.0000 mg | ORAL_TABLET | Freq: Every day | ORAL | 0 refills | Status: DC

## 2017-01-08 NOTE — Telephone Encounter (Signed)
Rec'd call pt states she is needing refill on her Lovastatin 90 day supply. Inform pt she is overdue for her annual appt which was due back in January. Can only send 30 day until she see MD. She made appt for 3/21, sent 30 day script to [facility].../lmb

## 2017-01-29 ENCOUNTER — Other Ambulatory Visit: Payer: Self-pay | Admitting: *Deleted

## 2017-01-31 ENCOUNTER — Encounter: Payer: Medicare HMO | Admitting: Internal Medicine

## 2017-02-01 ENCOUNTER — Other Ambulatory Visit: Payer: Self-pay

## 2017-02-01 NOTE — Telephone Encounter (Signed)
Fax was received for 90-day supply of Lovastatin 40mg . On 01/08/17 a 30 day supply was sent in and she was made aware that she needed to keep her 01/31/17 appt for future refills per Lucy.   Medication refill has been denied.

## 2017-02-08 ENCOUNTER — Telehealth: Payer: Self-pay | Admitting: Internal Medicine

## 2017-02-08 ENCOUNTER — Other Ambulatory Visit: Payer: Self-pay | Admitting: Internal Medicine

## 2017-02-08 MED ORDER — LOVASTATIN 40 MG PO TABS: 40.0000 mg | ORAL_TABLET | Freq: Every day | ORAL | 0 refills | Status: DC

## 2017-02-08 NOTE — Telephone Encounter (Signed)
K. A 30 day supply has been sent in.

## 2017-02-08 NOTE — Telephone Encounter (Signed)
lovastatin (MEVACOR) 40 MG tablet   Patient is going to run out of her medication about a week before her appointment. She wanted to know if we could send in that short supply. Please advise, Thank you.

## 2017-02-08 NOTE — Telephone Encounter (Signed)
Patient informed. 

## 2017-02-28 ENCOUNTER — Encounter: Payer: Self-pay | Admitting: Internal Medicine

## 2017-02-28 ENCOUNTER — Other Ambulatory Visit (INDEPENDENT_AMBULATORY_CARE_PROVIDER_SITE_OTHER): Payer: Medicare HMO

## 2017-02-28 ENCOUNTER — Ambulatory Visit (INDEPENDENT_AMBULATORY_CARE_PROVIDER_SITE_OTHER): Payer: Medicare HMO | Admitting: Internal Medicine

## 2017-02-28 VITALS — BP 132/78 | HR 73 | Ht 62.0 in | Wt 124.0 lb

## 2017-02-28 DIAGNOSIS — R739 Hyperglycemia, unspecified: Secondary | ICD-10-CM

## 2017-02-28 DIAGNOSIS — Z0000 Encounter for general adult medical examination without abnormal findings: Secondary | ICD-10-CM

## 2017-02-28 DIAGNOSIS — E2839 Other primary ovarian failure: Secondary | ICD-10-CM | POA: Diagnosis not present

## 2017-02-28 DIAGNOSIS — I10 Essential (primary) hypertension: Secondary | ICD-10-CM

## 2017-02-28 DIAGNOSIS — E785 Hyperlipidemia, unspecified: Secondary | ICD-10-CM

## 2017-02-28 LAB — BASIC METABOLIC PANEL
BUN: 14 mg/dL (ref 6–23)
CHLORIDE: 104 meq/L (ref 96–112)
CO2: 30 meq/L (ref 19–32)
Calcium: 9.9 mg/dL (ref 8.4–10.5)
Creatinine, Ser: 0.65 mg/dL (ref 0.40–1.20)
GFR: 92.77 mL/min (ref >60.00)
GLUCOSE: 92 mg/dL (ref 70–99)
POTASSIUM: 4 meq/L (ref 3.5–5.1)
SODIUM: 141 meq/L (ref 135–145)

## 2017-02-28 LAB — URINALYSIS, ROUTINE W REFLEX MICROSCOPIC
BILIRUBIN URINE: NEGATIVE
Hgb urine dipstick: NEGATIVE
Ketones, ur: NEGATIVE
Nitrite: NEGATIVE
PH: 7 (ref 5.0–8.0)
RBC / HPF: NONE SEEN (ref 0–?)
SPECIFIC GRAVITY, URINE: 1.02 (ref 1.000–1.030)
Total Protein, Urine: NEGATIVE
UROBILINOGEN UA: 0.2 (ref 0.0–1.0)
Urine Glucose: NEGATIVE

## 2017-02-28 LAB — HEPATIC FUNCTION PANEL
ALBUMIN: 4.4 g/dL (ref 3.5–5.2)
ALK PHOS: 54 U/L (ref 39–117)
ALT: 12 U/L (ref 0–35)
AST: 17 U/L (ref 0–37)
Bilirubin, Direct: 0.1 mg/dL (ref 0.0–0.3)
TOTAL PROTEIN: 7.3 g/dL (ref 6.0–8.3)
Total Bilirubin: 0.3 mg/dL (ref 0.2–1.2)

## 2017-02-28 LAB — LIPID PANEL
CHOLESTEROL: 151 mg/dL (ref 0–200)
HDL: 56.7 mg/dL (ref >39.00)
LDL Cholesterol: 71 mg/dL (ref 0–99)
NonHDL: 94.28
TRIGLYCERIDES: 117 mg/dL (ref 0.0–149.0)
Total CHOL/HDL Ratio: 3
VLDL: 23.4 mg/dL (ref 0.0–40.0)

## 2017-02-28 LAB — HEMOGLOBIN A1C: Hgb A1c MFr Bld: 5.6 % (ref 4.6–6.5)

## 2017-02-28 LAB — CBC WITH DIFFERENTIAL/PLATELET
BASOS ABS: 0.1 10*3/uL (ref 0.0–0.1)
Basophils Relative: 0.8 % (ref 0.0–3.0)
Eosinophils Absolute: 0.1 10*3/uL (ref 0.0–0.7)
Eosinophils Relative: 1.3 % (ref 0.0–5.0)
HCT: 41.6 % (ref 36.0–46.0)
Hemoglobin: 13.6 g/dL (ref 12.0–15.0)
LYMPHS ABS: 2.6 10*3/uL (ref 0.7–4.0)
Lymphocytes Relative: 32.8 % (ref 12.0–46.0)
MCHC: 32.7 g/dL (ref 30.0–36.0)
MCV: 91.1 fl (ref 78.0–100.0)
Monocytes Absolute: 0.8 10*3/uL (ref 0.1–1.0)
Monocytes Relative: 9.5 % (ref 3.0–12.0)
NEUTROS PCT: 55.6 % (ref 43.0–77.0)
Neutro Abs: 4.5 10*3/uL (ref 1.4–7.7)
PLATELETS: 246 10*3/uL (ref 150.0–400.0)
RBC: 4.56 Mil/uL (ref 3.87–5.11)
RDW: 13.4 % (ref 11.5–15.5)
WBC: 8.1 10*3/uL (ref 4.0–10.5)

## 2017-02-28 LAB — TSH: TSH: 3.17 u[IU]/mL (ref 0.35–4.50)

## 2017-02-28 MED ORDER — LOVASTATIN 40 MG PO TABS
40.0000 mg | ORAL_TABLET | Freq: Every day | ORAL | 3 refills | Status: DC
Start: 2017-02-28 — End: 2018-02-22

## 2017-02-28 MED ORDER — ASPIRIN EC 81 MG PO TBEC: 81.0000 mg | DELAYED_RELEASE_TABLET | Freq: Every day | ORAL | 11 refills | Status: AC

## 2017-02-28 NOTE — Progress Notes (Signed)
Pre visit review using our clinic review tool, if applicable. No additional management support is needed unless otherwise documented below in the visit note. 

## 2017-02-28 NOTE — Assessment & Plan Note (Signed)
On no Meds - stable overall by history and exam, recent data reviewed with pt, and pt to continue medical treatment as before,  to f/u any worsening symptoms or concerns BP Readings from Last 3 Encounters:  02/28/17 132/78  12/08/15 120/74  02/02/15 128/86  \

## 2017-02-28 NOTE — Progress Notes (Signed)
Subjective:    Patient ID: Brittany Burgess, female    DOB: [birth year], 83 y.o.   MRN: [id number]  HPI  Here for wellness and f/u;  Overall doing ok;  Pt denies Chest pain, worsening SOB, DOE, wheezing, orthopnea, PND, worsening LE edema, palpitations, dizziness or syncope.  Pt denies neurological change such as new headache, facial or extremity weakness.  Pt denies polydipsia, polyuria, or low sugar symptoms. Pt states overall good compliance with treatment and medications, good tolerability, and has been trying to follow appropriate diet.  Pt denies worsening depressive symptoms, suicidal ideation or panic. No fever, night sweats, wt loss, loss of appetite, or other constitutional symptoms.  Pt states good ability with ADL's, has low fall risk, home safety reviewed and adequate, no other significant changes in hearing or vision, and only occasionally active with exercise. s/p bilat cataracts 2005 and 2007, seen every 6 mo for glaucoma, eye drops working well.  No other hx, seems remarkably well for her age Past Medical History:  Diagnosis Date  . Allergic conjunctivitis of both eyes   . Allergic rhinitis   . Colon polyps   . Glaucoma   . Hypertension   . Osteopenia   . Vitamin D deficiency    Past Surgical History:  Procedure Laterality Date  . CATARACT EXTRACTION  2007  . FOOT SURGERY      reports that she has never smoked. She has never used smokeless tobacco. She reports that she does not drink alcohol or use drugs. family history includes Cancer in her father and mother; Diabetes in her father and mother. Allergies  Allergen Reactions  . Combigan [Brimonidine Tartrate-Timolol]     Red eyelids    Current Outpatient Prescriptions on File Prior to Visit  Medication Sig Dispense Refill  . lovastatin (MEVACOR) 40 MG tablet Take 1 tablet (40 mg total) by mouth daily. Must keep 01/31/17 appt for future refills 30 tablet 0  . travoprost, benzalkonium, (TRAVATAN) 0.004 % ophthalmic  solution 1 drop at bedtime.     No current facility-administered medications on file prior to visit.       Review of Systems Constitutional: Negative for other unusual diaphoresis, sweats, appetite or weight changes HENT: Negative for other worsening hearing loss, ear pain, facial swelling, mouth sores or neck stiffness.   Eyes: Negative for other worsening pain, redness or other visual disturbance.  Respiratory: Negative for other stridor or swelling Cardiovascular: Negative for other palpitations or other chest pain  Gastrointestinal: Negative for worsening diarrhea or loose stools, blood in stool, distention or other pain Genitourinary: Negative for hematuria, flank pain or other change in urine volume.  Musculoskeletal: Negative for myalgias or other joint swelling.  Skin: Negative for other color change, or other wound or worsening drainage.  Neurological: Negative for other syncope or numbness. Hematological: Negative for other adenopathy or swelling Psychiatric/Behavioral: Negative for hallucinations, other worsening agitation, SI, self-injury, or new decreased concentration All other system neg per pt    Objective:   Physical Exam BP 132/78   Pulse 73   Ht 5\' 2"  (1.575 m)   Wt 124 lb (56.2 kg)   SpO2 99%   BMI 22.68 kg/m  VS noted,  Constitutional: Pt appears in NAD HENT: Head: NCAT.  Right Ear: External ear normal.  Left Ear: External ear normal.  Eyes: . Pupils are equal, round, and reactive to light. Conjunctivae and EOM are normal Nose: without d/c or deformity Neck: Neck supple. Gross normal ROM Cardiovascular: Normal  rate and regular rhythm.   Pulmonary/Chest: Effort normal and breath sounds without rales or wheezing.  Abd:  Soft, NT, ND, + BS, no organomegaly Neurological: Pt is alert. At baseline orientation, motor grossly intact Skin: Skin is warm. No rashes, other new lesions, no LE edema Psychiatric: Pt behavior is normal without agitation  No other exam  findings    Assessment & Plan:

## 2017-02-28 NOTE — Assessment & Plan Note (Signed)
Very mild, for a1c with labs today

## 2017-02-28 NOTE — Assessment & Plan Note (Signed)
stable overall by history and exam, recent data reviewed with pt, and pt to continue medical treatment as before,  to f/u any worsening symptoms or concerns Lab Results  Component Value Date   LDLCALC 48 12/08/2015   For f/u lab

## 2017-02-28 NOTE — Assessment & Plan Note (Signed)

## 2017-02-28 NOTE — Patient Instructions (Addendum)
Please continue all other medications as before, and refills have been done if requested.  Please have the pharmacy call with any other refills you may need.  Please continue your efforts at being more active, low cholesterol diet, and weight control.  You are otherwise up to date with prevention measures today.  Please keep your appointments with your specialists as you may have planned  Please schedule the bone density test before leaving today at the scheduling desk (where you check out)  Please go to the LAB in the Basement (turn left off the elevator) for the tests to be done today  You will be contacted by phone if any changes need to be made immediately.  Otherwise, you will receive a letter about your results with an explanation, but please check with MyChart first.  Please remember to sign up for MyChart if you have not done so, as this will be important to you in the future with finding out test results, communicating by private email, and scheduling acute appointments online when needed.  Please return in 1 year for your yearly visit, or sooner if needed, with Lab testing done 3-5 days before  

## 2017-03-01 ENCOUNTER — Encounter: Payer: Self-pay | Admitting: Internal Medicine

## 2017-03-07 ENCOUNTER — Ambulatory Visit (INDEPENDENT_AMBULATORY_CARE_PROVIDER_SITE_OTHER)
Admission: RE | Admit: 2017-03-07 | Discharge: 2017-03-07 | Disposition: A | Discharge status: Home / Self Care | Payer: Medicare HMO | Source: Ambulatory Visit | Attending: Internal Medicine | Admitting: Internal Medicine

## 2017-03-07 DIAGNOSIS — E2839 Other primary ovarian failure: Secondary | ICD-10-CM

## 2017-03-08 ENCOUNTER — Telehealth: Payer: Self-pay | Admitting: Internal Medicine

## 2017-03-08 ENCOUNTER — Telehealth: Payer: Self-pay

## 2017-03-08 ENCOUNTER — Other Ambulatory Visit: Payer: Self-pay | Admitting: Internal Medicine

## 2017-03-08 ENCOUNTER — Encounter: Payer: Self-pay | Admitting: Internal Medicine

## 2017-03-08 MED ORDER — ALENDRONATE SODIUM 70 MG PO TABS: 70.0000 mg | ORAL_TABLET | ORAL | 3 refills | Status: DC

## 2017-03-08 NOTE — Telephone Encounter (Signed)
Spoke with pt, She stated that she was given a generic medication for a 90d supply so everything worked out fine.

## 2017-03-08 NOTE — Telephone Encounter (Signed)
Pt has taken fosamax 70 mg before and it is a non-preferred drug with her insurance. Is there something else she can take for her osteopenia.

## 2017-03-08 NOTE — Telephone Encounter (Signed)
Please ask pt to check with her pharmacist regarding which medication is covered under her insurance, as I cannot know this, and we dont want to just try one by one until something works

## 2017-03-08 NOTE — Telephone Encounter (Signed)
Pt was notified of DEXA results and expressed understanding.

## 2017-03-08 NOTE — Telephone Encounter (Signed)
-----   Message from Biagio Borg, MD sent at 03/08/2017  9:07 AM EDT ----- Letter sent, cont same tx except  The test results show that your current treatment is OK, except the test does show a significant level of bone loss called osteopenia (which is not as severe as osteoporosis).  We should start the fosamax 70 mg daily which can slow down the process for getting to osteoporosis.  We will send a new prescription, and you should be notified from the office as well.  Brittany Burgess to please inform pt, I will do rx

## 2017-05-24 DIAGNOSIS — Z0000 Encounter for general adult medical examination without abnormal findings: Secondary | ICD-10-CM | POA: Diagnosis not present

## 2017-05-24 DIAGNOSIS — Z6823 Body mass index (BMI) 23.0-23.9, adult: Secondary | ICD-10-CM | POA: Diagnosis not present

## 2017-05-24 DIAGNOSIS — H409 Unspecified glaucoma: Secondary | ICD-10-CM | POA: Diagnosis not present

## 2017-05-24 DIAGNOSIS — Z7982 Long term (current) use of aspirin: Secondary | ICD-10-CM | POA: Diagnosis not present

## 2017-05-24 DIAGNOSIS — D509 Iron deficiency anemia, unspecified: Secondary | ICD-10-CM | POA: Diagnosis not present

## 2017-05-24 DIAGNOSIS — E785 Hyperlipidemia, unspecified: Secondary | ICD-10-CM | POA: Diagnosis not present

## 2017-05-24 DIAGNOSIS — R030 Elevated blood-pressure reading, without diagnosis of hypertension: Secondary | ICD-10-CM | POA: Diagnosis not present

## 2017-05-31 DIAGNOSIS — H524 Presbyopia: Secondary | ICD-10-CM | POA: Diagnosis not present

## 2017-05-31 DIAGNOSIS — H40013 Open angle with borderline findings, low risk, bilateral: Secondary | ICD-10-CM | POA: Diagnosis not present

## 2017-08-02 DIAGNOSIS — R69 Illness, unspecified: Secondary | ICD-10-CM | POA: Diagnosis not present

## 2017-09-04 DIAGNOSIS — R69 Illness, unspecified: Secondary | ICD-10-CM | POA: Diagnosis not present

## 2017-12-06 DIAGNOSIS — H524 Presbyopia: Secondary | ICD-10-CM | POA: Diagnosis not present

## 2017-12-06 DIAGNOSIS — H40013 Open angle with borderline findings, low risk, bilateral: Secondary | ICD-10-CM | POA: Diagnosis not present

## 2018-01-02 DIAGNOSIS — R69 Illness, unspecified: Secondary | ICD-10-CM | POA: Diagnosis not present

## 2018-02-22 ENCOUNTER — Other Ambulatory Visit: Payer: Self-pay | Admitting: Internal Medicine

## 2018-03-05 ENCOUNTER — Other Ambulatory Visit (INDEPENDENT_AMBULATORY_CARE_PROVIDER_SITE_OTHER): Payer: Medicare HMO

## 2018-03-05 ENCOUNTER — Ambulatory Visit (INDEPENDENT_AMBULATORY_CARE_PROVIDER_SITE_OTHER): Payer: Medicare HMO | Admitting: Internal Medicine

## 2018-03-05 ENCOUNTER — Encounter: Payer: Self-pay | Admitting: Internal Medicine

## 2018-03-05 VITALS — BP 116/82 | HR 84 | Temp 98.5°F | Ht 62.0 in | Wt 124.0 lb

## 2018-03-05 DIAGNOSIS — Z0000 Encounter for general adult medical examination without abnormal findings: Secondary | ICD-10-CM

## 2018-03-05 DIAGNOSIS — R739 Hyperglycemia, unspecified: Secondary | ICD-10-CM

## 2018-03-05 LAB — CBC WITH DIFFERENTIAL/PLATELET
Basophils Absolute: 0 10*3/uL (ref 0.0–0.1)
Basophils Relative: 0.4 % (ref 0.0–3.0)
EOS PCT: 2.1 % (ref 0.0–5.0)
Eosinophils Absolute: 0.2 10*3/uL (ref 0.0–0.7)
HEMATOCRIT: 42 % (ref 36.0–46.0)
Hemoglobin: 14 g/dL (ref 12.0–15.0)
LYMPHS ABS: 2.7 10*3/uL (ref 0.7–4.0)
LYMPHS PCT: 36.6 % (ref 12.0–46.0)
MCHC: 33.5 g/dL (ref 30.0–36.0)
MCV: 90.2 fl (ref 78.0–100.0)
MONOS PCT: 8.1 % (ref 3.0–12.0)
Monocytes Absolute: 0.6 10*3/uL (ref 0.1–1.0)
NEUTROS ABS: 3.9 10*3/uL (ref 1.4–7.7)
NEUTROS PCT: 52.8 % (ref 43.0–77.0)
Platelets: 262 10*3/uL (ref 150.0–400.0)
RBC: 4.65 Mil/uL (ref 3.87–5.11)
RDW: 13.4 % (ref 11.5–15.5)
WBC: 7.4 10*3/uL (ref 4.0–10.5)

## 2018-03-05 LAB — TSH: TSH: 2.99 u[IU]/mL (ref 0.35–4.50)

## 2018-03-05 LAB — URINALYSIS, ROUTINE W REFLEX MICROSCOPIC
BILIRUBIN URINE: NEGATIVE
Ketones, ur: NEGATIVE
LEUKOCYTES UA: NEGATIVE
NITRITE: NEGATIVE
PH: 5.5 (ref 5.0–8.0)
Specific Gravity, Urine: >=1.03 — AB (ref 1.000–1.030)
TOTAL PROTEIN, URINE-UPE24: NEGATIVE
URINE GLUCOSE: NEGATIVE
UROBILINOGEN UA: 0.2 (ref 0.0–1.0)

## 2018-03-05 LAB — BASIC METABOLIC PANEL
BUN: 16 mg/dL (ref 6–23)
CO2: 31 mEq/L (ref 19–32)
CREATININE: 0.66 mg/dL (ref 0.40–1.20)
Calcium: 9.7 mg/dL (ref 8.4–10.5)
Chloride: 103 mEq/L (ref 96–112)
GFR: 90.93 mL/min (ref >60.00)
Glucose, Bld: 101 mg/dL — ABNORMAL HIGH (ref 70–99)
Potassium: 3.9 mEq/L (ref 3.5–5.1)
SODIUM: 139 meq/L (ref 135–145)

## 2018-03-05 LAB — LIPID PANEL
CHOL/HDL RATIO: 3
CHOLESTEROL: 147 mg/dL (ref 0–200)
HDL: 56.2 mg/dL (ref >39.00)
LDL Cholesterol: 61 mg/dL (ref 0–99)
NonHDL: 91.27
TRIGLYCERIDES: 149 mg/dL (ref 0.0–149.0)
VLDL: 29.8 mg/dL (ref 0.0–40.0)

## 2018-03-05 LAB — HEPATIC FUNCTION PANEL
ALBUMIN: 4.2 g/dL (ref 3.5–5.2)
ALT: 13 U/L (ref 0–35)
AST: 18 U/L (ref 0–37)
Alkaline Phosphatase: 54 U/L (ref 39–117)
Bilirubin, Direct: 0 mg/dL (ref 0.0–0.3)
TOTAL PROTEIN: 7.1 g/dL (ref 6.0–8.3)
Total Bilirubin: 0.3 mg/dL (ref 0.2–1.2)

## 2018-03-05 LAB — HEMOGLOBIN A1C: Hgb A1c MFr Bld: 5.6 % (ref 4.6–6.5)

## 2018-03-05 NOTE — Progress Notes (Signed)
Subjective:    Patient ID: Brittany Burgess, female    DOB: 1933-06-03, 84 y.o.   MRN: [id number]  HPI  Here for wellness and f/u;  Overall doing ok;  Pt denies Chest pain, worsening SOB, DOE, wheezing, orthopnea, PND, worsening LE edema, palpitations, dizziness or syncope.  Pt denies neurological change such as new headache, facial or extremity weakness.  Pt denies polydipsia, polyuria, or low sugar symptoms. Pt states overall good compliance with treatment and medications, good tolerability, and has been trying to follow appropriate diet.  Pt denies worsening depressive symptoms, suicidal ideation or panic. No fever, night sweats, wt loss, loss of appetite, or other constitutional symptoms.  Pt states good ability with ADL's, has low fall risk, home safety reviewed and adequate, no other significant changes in hearing or vision, and only occasionally active with exercise.  Mentions stress over taking husband with recent cva.  No other new complaints or interval hx Past Medical History:  Diagnosis Date  . Allergic conjunctivitis of both eyes   . Allergic rhinitis   . Colon polyps   . Glaucoma   . Hypertension   . Osteopenia   . Vitamin D deficiency    Past Surgical History:  Procedure Laterality Date  . CATARACT EXTRACTION  2007  . FOOT SURGERY      reports that she has never smoked. She has never used smokeless tobacco. She reports that she does not drink alcohol or use drugs. family history includes Cancer in her father and mother; Diabetes in her father and mother. Allergies  Allergen Reactions  . Combigan [Brimonidine Tartrate-Timolol]     Red eyelids    Current Outpatient Medications on File Prior to Visit  Medication Sig Dispense Refill  . alendronate (FOSAMAX) 70 MG tablet Take 1 tablet (70 mg total) by mouth every 7 (seven) days. Take with a full glass of water on an empty stomach. 12 tablet 3  . aspirin EC 81 MG tablet Take 1 tablet (81 mg total) by mouth daily. 90 tablet  11  . lovastatin (MEVACOR) 40 MG tablet TAKE 1 TABLET BY MOUTH EVERY DAY 90 tablet 0  . travoprost, benzalkonium, (TRAVATAN) 0.004 % ophthalmic solution 1 drop at bedtime.     No current facility-administered medications on file prior to visit.    Review of Systems Constitutional: Negative for other unusual diaphoresis, sweats, appetite or weight changes HENT: Negative for other worsening hearing loss, ear pain, facial swelling, mouth sores or neck stiffness.   Eyes: Negative for other worsening pain, redness or other visual disturbance.  Respiratory: Negative for other stridor or swelling Cardiovascular: Negative for other palpitations or other chest pain  Gastrointestinal: Negative for worsening diarrhea or loose stools, blood in stool, distention or other pain Genitourinary: Negative for hematuria, flank pain or other change in urine volume.  Musculoskeletal: Negative for myalgias or other joint swelling.  Skin: Negative for other color change, or other wound or worsening drainage.  Neurological: Negative for other syncope or numbness. Hematological: Negative for other adenopathy or swelling Psychiatric/Behavioral: Negative for hallucinations, other worsening agitation, SI, self-injury, or new decreased concentration All other system neg per pt    Objective:   Physical Exam BP 116/82   Pulse 84   Temp 98.5 F (36.9 C) (Oral)   Ht 5\' 2"  (1.575 m)   Wt 124 lb (56.2 kg)   SpO2 97%   BMI 22.68 kg/m  VS noted,  Constitutional: Pt is oriented to person, place, and time. Appears  well-developed and well-nourished, in no significant distress and comfortable Head: Normocephalic and atraumatic  Eyes: Conjunctivae and EOM are normal. Pupils are equal, round, and reactive to light Right Ear: External ear normal without discharge Left Ear: External ear normal without discharge Nose: Nose without discharge or deformity Mouth/Throat: Oropharynx is without other ulcerations and moist  Neck:  Normal range of motion. Neck supple. No JVD present. No tracheal deviation present or significant neck LA or mass Cardiovascular: Normal rate, regular rhythm, normal heart sounds and intact distal pulses.   Pulmonary/Chest: WOB normal and breath sounds without rales or wheezing  Abdominal: Soft. Bowel sounds are normal. NT. No HSM  Musculoskeletal: Normal range of motion. Exhibits no edema Lymphadenopathy: Has no other cervical adenopathy.  Neurological: Pt is alert and oriented to person, place, and time. Pt has normal reflexes. No cranial nerve deficit. Motor grossly intact, Gait intact Skin: Skin is warm and dry. No rash noted or new ulcerations Psychiatric:  Has normal mood and affect. Behavior is normal without agitation No other exam findings Lab Results  Component Value Date   WBC 8.1 02/28/2017   HGB 13.6 02/28/2017   HCT 41.6 02/28/2017   PLT 246.0 02/28/2017   GLUCOSE 92 02/28/2017   CHOL 151 02/28/2017   TRIG 117.0 02/28/2017   HDL 56.70 02/28/2017   LDLCALC 71 02/28/2017   ALT 12 02/28/2017   AST 17 02/28/2017   NA 141 02/28/2017   K 4.0 02/28/2017   CL 104 02/28/2017   CREATININE 0.65 02/28/2017   BUN 14 02/28/2017   CO2 30 02/28/2017   TSH 3.17 02/28/2017   HGBA1C 5.6 02/28/2017      Assessment & Plan:

## 2018-03-05 NOTE — Assessment & Plan Note (Signed)

## 2018-03-05 NOTE — Patient Instructions (Signed)

## 2018-04-24 DIAGNOSIS — Z8249 Family history of ischemic heart disease and other diseases of the circulatory system: Secondary | ICD-10-CM | POA: Diagnosis not present

## 2018-04-24 DIAGNOSIS — Z833 Family history of diabetes mellitus: Secondary | ICD-10-CM | POA: Diagnosis not present

## 2018-04-24 DIAGNOSIS — Z823 Family history of stroke: Secondary | ICD-10-CM | POA: Diagnosis not present

## 2018-04-24 DIAGNOSIS — H04129 Dry eye syndrome of unspecified lacrimal gland: Secondary | ICD-10-CM | POA: Diagnosis not present

## 2018-04-24 DIAGNOSIS — Z809 Family history of malignant neoplasm, unspecified: Secondary | ICD-10-CM | POA: Diagnosis not present

## 2018-04-24 DIAGNOSIS — R52 Pain, unspecified: Secondary | ICD-10-CM | POA: Diagnosis not present

## 2018-04-24 DIAGNOSIS — H409 Unspecified glaucoma: Secondary | ICD-10-CM | POA: Diagnosis not present

## 2018-04-24 DIAGNOSIS — R030 Elevated blood-pressure reading, without diagnosis of hypertension: Secondary | ICD-10-CM | POA: Diagnosis not present

## 2018-04-24 DIAGNOSIS — E785 Hyperlipidemia, unspecified: Secondary | ICD-10-CM | POA: Diagnosis not present

## 2018-05-02 DIAGNOSIS — R69 Illness, unspecified: Secondary | ICD-10-CM | POA: Diagnosis not present

## 2018-05-21 ENCOUNTER — Other Ambulatory Visit: Payer: Self-pay | Admitting: Internal Medicine

## 2018-06-06 DIAGNOSIS — H40013 Open angle with borderline findings, low risk, bilateral: Secondary | ICD-10-CM | POA: Diagnosis not present

## 2018-08-05 ENCOUNTER — Ambulatory Visit (INDEPENDENT_AMBULATORY_CARE_PROVIDER_SITE_OTHER): Payer: Medicare HMO

## 2018-08-05 DIAGNOSIS — Z23 Encounter for immunization: Secondary | ICD-10-CM

## 2018-09-11 DIAGNOSIS — L820 Inflamed seborrheic keratosis: Secondary | ICD-10-CM | POA: Diagnosis not present

## 2018-12-12 DIAGNOSIS — H40013 Open angle with borderline findings, low risk, bilateral: Secondary | ICD-10-CM | POA: Diagnosis not present

## 2018-12-12 DIAGNOSIS — H26491 Other secondary cataract, right eye: Secondary | ICD-10-CM | POA: Diagnosis not present

## 2019-02-25 ENCOUNTER — Other Ambulatory Visit: Payer: Self-pay | Admitting: Internal Medicine

## 2019-03-12 ENCOUNTER — Encounter: Payer: Self-pay | Admitting: Internal Medicine

## 2019-03-12 ENCOUNTER — Other Ambulatory Visit (INDEPENDENT_AMBULATORY_CARE_PROVIDER_SITE_OTHER): Payer: PPO

## 2019-03-12 ENCOUNTER — Ambulatory Visit: Payer: PPO | Admitting: Internal Medicine

## 2019-03-12 DIAGNOSIS — I10 Essential (primary) hypertension: Secondary | ICD-10-CM

## 2019-03-12 DIAGNOSIS — Z0000 Encounter for general adult medical examination without abnormal findings: Secondary | ICD-10-CM

## 2019-03-12 DIAGNOSIS — R739 Hyperglycemia, unspecified: Secondary | ICD-10-CM | POA: Diagnosis not present

## 2019-03-12 LAB — HEPATIC FUNCTION PANEL
ALT: 11 U/L (ref 0–35)
AST: 18 U/L (ref 0–37)
Albumin: 4.2 g/dL (ref 3.5–5.2)
Alkaline Phosphatase: 50 U/L (ref 39–117)
Bilirubin, Direct: 0.1 mg/dL (ref 0.0–0.3)
Total Bilirubin: 0.4 mg/dL (ref 0.2–1.2)
Total Protein: 6.9 g/dL (ref 6.0–8.3)

## 2019-03-12 LAB — HEMOGLOBIN A1C: Hgb A1c MFr Bld: 5.6 % (ref 4.6–6.5)

## 2019-03-12 LAB — CBC WITH DIFFERENTIAL/PLATELET
Basophils Absolute: 0 10*3/uL (ref 0.0–0.1)
Basophils Relative: 0.6 % (ref 0.0–3.0)
Eosinophils Absolute: 0.1 10*3/uL (ref 0.0–0.7)
Eosinophils Relative: 1.4 % (ref 0.0–5.0)
HCT: 40.9 % (ref 36.0–46.0)
Hemoglobin: 13.7 g/dL (ref 12.0–15.0)
Lymphocytes Relative: 35.6 % (ref 12.0–46.0)
Lymphs Abs: 2.8 10*3/uL (ref 0.7–4.0)
MCHC: 33.4 g/dL (ref 30.0–36.0)
MCV: 91.1 fl (ref 78.0–100.0)
Monocytes Absolute: 0.8 10*3/uL (ref 0.1–1.0)
Monocytes Relative: 10.4 % (ref 3.0–12.0)
Neutro Abs: 4 10*3/uL (ref 1.4–7.7)
Neutrophils Relative %: 52 % (ref 43.0–77.0)
Platelets: 252 10*3/uL (ref 150.0–400.0)
RBC: 4.49 Mil/uL (ref 3.87–5.11)
RDW: 13.4 % (ref 11.5–15.5)
WBC: 7.7 10*3/uL (ref 4.0–10.5)

## 2019-03-12 LAB — BASIC METABOLIC PANEL
BUN: 12 mg/dL (ref 6–23)
CO2: 30 mEq/L (ref 19–32)
Calcium: 9.4 mg/dL (ref 8.4–10.5)
Chloride: 103 mEq/L (ref 96–112)
Creatinine, Ser: 0.67 mg/dL (ref 0.40–1.20)
GFR: 83.87 mL/min (ref >60.00)
Glucose, Bld: 90 mg/dL (ref 70–99)
Potassium: 4.2 mEq/L (ref 3.5–5.1)
Sodium: 140 mEq/L (ref 135–145)

## 2019-03-12 LAB — TSH: TSH: 4.29 u[IU]/mL (ref 0.35–4.50)

## 2019-03-12 LAB — LIPID PANEL
Cholesterol: 136 mg/dL (ref 0–200)
HDL: 54.3 mg/dL (ref >39.00)
LDL Cholesterol: 53 mg/dL (ref 0–99)
NonHDL: 81.97
Total CHOL/HDL Ratio: 3
Triglycerides: 145 mg/dL (ref 0.0–149.0)
VLDL: 29 mg/dL (ref 0.0–40.0)

## 2019-03-12 NOTE — Progress Notes (Signed)
Patient ID: Brittany Burgess, female   DOB: 1933/09/03, 85 y.o.   MRN: [id number]  Error - no visit as pt did not answer the phone x 2

## 2019-03-12 NOTE — Patient Instructions (Signed)
No charge - pt not seen

## 2019-05-12 ENCOUNTER — Telehealth: Payer: Self-pay | Admitting: Internal Medicine

## 2019-05-12 NOTE — Telephone Encounter (Signed)
Copied from [place] [id number]. Topic: General - Call Back - No Documentation >> May 12, 2019 11:42 AM Erick Blinks wrote: Reason for CRM: Pt's daughter Lavina Hamman called to report a recent decline in pt's mental well being. Pt's daughter is requesting a call back to discuss the best plan of action. She thinks pt is experiencing signs of dementia.  Best Contact: [phone] (cell) (home) [phone]

## 2019-05-12 NOTE — Telephone Encounter (Signed)
Noted  

## 2019-05-12 NOTE — Telephone Encounter (Signed)
Since this is a new issue pt would need an OV, in office or doxy, to discuss with PCP. Please schedule. Thank you!

## 2019-05-12 NOTE — Telephone Encounter (Signed)
Informed daughter to make appt.  Will call back to schedule.

## 2019-05-19 ENCOUNTER — Ambulatory Visit (INDEPENDENT_AMBULATORY_CARE_PROVIDER_SITE_OTHER): Payer: PPO | Admitting: Internal Medicine

## 2019-05-19 ENCOUNTER — Other Ambulatory Visit: Payer: Self-pay

## 2019-05-19 ENCOUNTER — Encounter: Payer: Self-pay | Admitting: Internal Medicine

## 2019-05-19 VITALS — BP 128/84 | HR 73 | Temp 98.0°F | Ht 62.0 in | Wt 118.0 lb

## 2019-05-19 DIAGNOSIS — R739 Hyperglycemia, unspecified: Secondary | ICD-10-CM

## 2019-05-19 DIAGNOSIS — F419 Anxiety disorder, unspecified: Secondary | ICD-10-CM | POA: Diagnosis not present

## 2019-05-19 DIAGNOSIS — I10 Essential (primary) hypertension: Secondary | ICD-10-CM | POA: Diagnosis not present

## 2019-05-19 MED ORDER — ALPRAZOLAM 0.25 MG PO TABS: ORAL_TABLET | ORAL | 0 refills | Status: DC

## 2019-05-19 NOTE — Patient Instructions (Signed)
Please take all new medication as prescribed - the alprazolam as needed for anxiety  Please take a small dose (half pill) if this is adequate for your needs, but remember you can take a whole pill or even 2 pills if needed (such as for sleep)  Please continue all other medications as before, and refills have been done if requested.  Please have the pharmacy call with any other refills you may need.  Please keep your appointments with your specialists as you may have planned

## 2019-05-19 NOTE — Progress Notes (Signed)
Subjective:    Patient ID: Brittany Burgess, female    DOB: Oct 09, 1933, 85 y.o.   MRN: [id number]  HPI  Here with daughter for support, husband recently place in [facility] for rehab only after left humerous fx and pt is grieving due to the separation after 1 yrs marriage; the plan now is to return home in 2-3 wks; Also has foley for urinary retention, will need f/u after to remove.  Pt herself denies worsening depressive symptoms, suicidal ideation, or panic, just markedly nervous as well.  Can only see him through the sliding door at the NG due to the pandemic.  Pt denies chest pain, increased sob or doe, wheezing, orthopnea, PND, increased LE swelling, palpitations, dizziness or syncope.   Pt denies polydipsia, polyuria  Past Medical History:  Diagnosis Date  . Allergic conjunctivitis of both eyes   . Allergic rhinitis   . Colon polyps   . Glaucoma   . Hypertension   . Osteopenia   . Vitamin D deficiency    Past Surgical History:  Procedure Laterality Date  . CATARACT EXTRACTION  2007  . FOOT SURGERY      reports that she has never smoked. She has never used smokeless tobacco. She reports that she does not drink alcohol or use drugs. family history includes Cancer in her father and mother; Diabetes in her father and mother. Allergies  Allergen Reactions  . Combigan [Brimonidine Tartrate-Timolol]     Red eyelids    Current Outpatient Medications on File Prior to Visit  Medication Sig Dispense Refill  . alendronate (FOSAMAX) 70 MG tablet Take 1 tablet (70 mg total) by mouth every 7 (seven) days. Take with a full glass of water on an empty stomach. 12 tablet 3  . aspirin EC 81 MG tablet Take 1 tablet (81 mg total) by mouth daily. 90 tablet 11  . lovastatin (MEVACOR) 40 MG tablet TAKE 1 TABLET BY MOUTH EVERY DAY 90 tablet 1  . travoprost, benzalkonium, (TRAVATAN) 0.004 % ophthalmic solution 1 drop at bedtime.     No current facility-administered medications on file prior to visit.     Review of Systems  Constitutional: Negative for other unusual diaphoresis or sweats HENT: Negative for ear discharge or swelling Eyes: Negative for other worsening visual disturbances Respiratory: Negative for stridor or other swelling  Gastrointestinal: Negative for worsening distension or other blood Genitourinary: Negative for retention or other urinary change Musculoskeletal: Negative for other MSK pain or swelling Skin: Negative for color change or other new lesions Neurological: Negative for worsening tremors and other numbness  Psychiatric/Behavioral: Negative for worsening agitation or other fatigue All other system neg per pt    Objective:   Physical Exam BP 128/84   Pulse 73   Temp 98 F (36.7 C) (Oral)   Ht 5\' 2"  (1.575 m)   Wt 118 lb (53.5 kg)   SpO2 98%   BMI 21.58 kg/m  VS noted,  Constitutional: Pt appears in NAD HENT: Head: NCAT.  Right Ear: External ear normal.  Left Ear: External ear normal.  Eyes: . Pupils are equal, round, and reactive to light. Conjunctivae and EOM are normal Nose: without d/c or deformity Neck: Neck supple. Gross normal ROM Cardiovascular: Normal rate and regular rhythm.   Pulmonary/Chest: Effort normal and breath sounds without rales or wheezing.  Neurological: Pt is alert. At baseline orientation, motor grossly intact Skin: Skin is warm. No rashes, other new lesions, no LE edema Psychiatric: Pt behavior is normal  without agitation , 2+ nervous , near tears No other exam findings Lab Results  Component Value Date   WBC 7.7 03/12/2019   HGB 13.7 03/12/2019   HCT 40.9 03/12/2019   PLT 252.0 03/12/2019   GLUCOSE 90 03/12/2019   CHOL 136 03/12/2019   TRIG 145.0 03/12/2019   HDL 54.30 03/12/2019   LDLCALC 53 03/12/2019   ALT 11 03/12/2019   AST 18 03/12/2019   NA 140 03/12/2019   K 4.2 03/12/2019   CL 103 03/12/2019   CREATININE 0.67 03/12/2019   BUN 12 03/12/2019   CO2 30 03/12/2019   TSH 4.29 03/12/2019   HGBA1C 5.6  03/12/2019      Assessment & Plan:

## 2019-05-24 ENCOUNTER — Encounter: Payer: Self-pay | Admitting: Internal Medicine

## 2019-05-24 NOTE — Assessment & Plan Note (Signed)
Ok for short term xanax prn asd,  to f/u any worsening symptoms or concerns, declines need for counseling

## 2019-05-24 NOTE — Assessment & Plan Note (Signed)
stable overall by history and exam, recent data reviewed with pt, and pt to continue medical treatment as before,  to f/u any worsening symptoms or concerns le  

## 2019-05-24 NOTE — Assessment & Plan Note (Signed)
stable overall by history and exam, recent data reviewed with pt, and pt to continue medical treatment as before,  to f/u any worsening symptoms or concerns  

## 2019-06-05 DIAGNOSIS — H40013 Open angle with borderline findings, low risk, bilateral: Secondary | ICD-10-CM | POA: Diagnosis not present

## 2019-06-05 DIAGNOSIS — H26491 Other secondary cataract, right eye: Secondary | ICD-10-CM | POA: Diagnosis not present

## 2019-06-13 ENCOUNTER — Other Ambulatory Visit: Payer: Self-pay

## 2019-08-09 ENCOUNTER — Other Ambulatory Visit: Payer: Self-pay | Admitting: Internal Medicine

## 2019-09-11 DIAGNOSIS — H40013 Open angle with borderline findings, low risk, bilateral: Secondary | ICD-10-CM | POA: Diagnosis not present

## 2019-09-11 DIAGNOSIS — Z961 Presence of intraocular lens: Secondary | ICD-10-CM | POA: Diagnosis not present

## 2019-09-18 ENCOUNTER — Ambulatory Visit (INDEPENDENT_AMBULATORY_CARE_PROVIDER_SITE_OTHER): Payer: PPO | Admitting: Internal Medicine

## 2019-09-18 ENCOUNTER — Encounter: Payer: Self-pay | Admitting: Internal Medicine

## 2019-09-18 DIAGNOSIS — F419 Anxiety disorder, unspecified: Secondary | ICD-10-CM

## 2019-09-18 MED ORDER — CITALOPRAM HYDROBROMIDE 10 MG PO TABS: 10.0000 mg | ORAL_TABLET | Freq: Every day | ORAL | 3 refills | Status: DC

## 2019-09-18 NOTE — Progress Notes (Signed)
Patient ID: Brittany Burgess, female   DOB: 1933/04/04, 85 y.o.   MRN: [id number]  Phone visit  Cumulative time during 7-day interval 11 min, there was not an associated office visit for this concern within a 7 day period.  Verbal consent for services obtained from patient prior to services given.  Names of all persons present for services: [name], MD, patient  Chief complaint: fatige and anxiety  History, background, results pertinent:  Here to d/w at length about her current home situation with ill bedridden husband for 3 mo, her as primary caretaker working with [employer], but tearful, depressed, anxious, fatigue and not looking forward to things, no enjoyment of thing and many tearful episodes.  Famly urged her to call.  Did not take the xanax as per last visit as just did not want "get hooked" on xanax.  Is ok with daily med without addictive potention now.  Past Medical History:  Diagnosis Date  . Allergic conjunctivitis of both eyes   . Allergic rhinitis   . Colon polyps   . Glaucoma   . Hypertension   . Osteopenia   . Vitamin D deficiency    No results found for this or any previous visit (from the past 48 hour(s)). Lab Results  Component Value Date   WBC 7.7 03/12/2019   HGB 13.7 03/12/2019   HCT 40.9 03/12/2019   PLT 252.0 03/12/2019   GLUCOSE 90 03/12/2019   CHOL 136 03/12/2019   TRIG 145.0 03/12/2019   HDL 54.30 03/12/2019   LDLCALC 53 03/12/2019   ALT 11 03/12/2019   AST 18 03/12/2019   NA 140 03/12/2019   K 4.2 03/12/2019   CL 103 03/12/2019   CREATININE 0.67 03/12/2019   BUN 12 03/12/2019   CO2 30 03/12/2019   TSH 4.29 03/12/2019   HGBA1C 5.6 03/12/2019    A/P/next steps:   Anxiety/depression - no SI or HI, declines counseling referral, for celexa 10 qd  [name] MD

## 2019-09-18 NOTE — Patient Instructions (Signed)
Please take all new medication as prescribed - the celexa  Please continue all other medications as before, and refills have been done if requested.  Please have the pharmacy call with any other refills you may need.  Please continue your efforts at being more active, low cholesterol diet, and weight control.  Please keep your appointments with your specialists as you may have planned

## 2019-10-02 DIAGNOSIS — Z961 Presence of intraocular lens: Secondary | ICD-10-CM | POA: Diagnosis not present

## 2019-10-02 DIAGNOSIS — H40013 Open angle with borderline findings, low risk, bilateral: Secondary | ICD-10-CM | POA: Diagnosis not present

## 2020-01-25 ENCOUNTER — Other Ambulatory Visit: Payer: Self-pay | Admitting: Internal Medicine

## 2020-01-25 NOTE — Telephone Encounter (Signed)
Please refill as per office routine med refill policy (all routine meds refilled for 3 mo or monthly per pt preference up to one year from last visit, then month to month grace period for 3 mo, then further med refills will have to be denied)  

## 2020-01-26 ENCOUNTER — Other Ambulatory Visit: Payer: Self-pay | Admitting: Internal Medicine

## 2020-01-26 NOTE — Telephone Encounter (Signed)
    1.Medication Requested: lovastatin (MEVACOR) 40 MG tablet  2. Pharmacy (Name, [place], City):[facility] #[id number] - [place], [place]  3. On Med List: yes  4. Last Visit with PCP: 09/18/19  5. Next visit date with PCP:   Agent: Please be advised that RX refills may take up to 3 business days. We ask that you follow-up with your pharmacy.

## 2020-01-27 MED ORDER — LOVASTATIN 40 MG PO TABS: 40.0000 mg | ORAL_TABLET | Freq: Every day | ORAL | 0 refills | Status: DC

## 2020-01-27 NOTE — Telephone Encounter (Signed)
Pt is due for annual appt in April. Per office policy sent 30 day to local pharmacy until appt...Brittany Burgess

## 2020-02-05 DIAGNOSIS — H40013 Open angle with borderline findings, low risk, bilateral: Secondary | ICD-10-CM | POA: Diagnosis not present

## 2020-02-18 ENCOUNTER — Other Ambulatory Visit: Payer: Self-pay

## 2020-02-18 ENCOUNTER — Encounter: Payer: Self-pay | Admitting: Internal Medicine

## 2020-02-18 ENCOUNTER — Ambulatory Visit (INDEPENDENT_AMBULATORY_CARE_PROVIDER_SITE_OTHER): Payer: PPO | Admitting: Internal Medicine

## 2020-02-18 VITALS — BP 130/84 | HR 74 | Temp 97.7°F | Ht 62.0 in | Wt 112.8 lb

## 2020-02-18 DIAGNOSIS — R739 Hyperglycemia, unspecified: Secondary | ICD-10-CM

## 2020-02-18 DIAGNOSIS — E559 Vitamin D deficiency, unspecified: Secondary | ICD-10-CM

## 2020-02-18 DIAGNOSIS — E538 Deficiency of other specified B group vitamins: Secondary | ICD-10-CM | POA: Diagnosis not present

## 2020-02-18 DIAGNOSIS — Z0000 Encounter for general adult medical examination without abnormal findings: Secondary | ICD-10-CM | POA: Diagnosis not present

## 2020-02-18 LAB — URINALYSIS, ROUTINE W REFLEX MICROSCOPIC
Bilirubin Urine: NEGATIVE
Ketones, ur: NEGATIVE
Leukocytes,Ua: NEGATIVE
Nitrite: NEGATIVE
Specific Gravity, Urine: 1.02 (ref 1.000–1.030)
Total Protein, Urine: NEGATIVE
Urine Glucose: NEGATIVE
Urobilinogen, UA: 0.2 (ref 0.0–1.0)
pH: 6.5 (ref 5.0–8.0)

## 2020-02-18 LAB — HEPATIC FUNCTION PANEL
ALT: 14 U/L (ref 0–35)
AST: 18 U/L (ref 0–37)
Albumin: 4.1 g/dL (ref 3.5–5.2)
Alkaline Phosphatase: 50 U/L (ref 39–117)
Bilirubin, Direct: 0.2 mg/dL (ref 0.0–0.3)
Total Bilirubin: 0.5 mg/dL (ref 0.2–1.2)
Total Protein: 6.7 g/dL (ref 6.0–8.3)

## 2020-02-18 LAB — CBC WITH DIFFERENTIAL/PLATELET
Basophils Absolute: 0 10*3/uL (ref 0.0–0.1)
Basophils Relative: 0.5 % (ref 0.0–3.0)
Eosinophils Absolute: 0.1 10*3/uL (ref 0.0–0.7)
Eosinophils Relative: 1.6 % (ref 0.0–5.0)
HCT: 38 % (ref 36.0–46.0)
Hemoglobin: 12.7 g/dL (ref 12.0–15.0)
Lymphocytes Relative: 35.5 % (ref 12.0–46.0)
Lymphs Abs: 2.1 10*3/uL (ref 0.7–4.0)
MCHC: 33.4 g/dL (ref 30.0–36.0)
MCV: 91.6 fl (ref 78.0–100.0)
Monocytes Absolute: 0.6 10*3/uL (ref 0.1–1.0)
Monocytes Relative: 10.1 % (ref 3.0–12.0)
Neutro Abs: 3.2 10*3/uL (ref 1.4–7.7)
Neutrophils Relative %: 52.3 % (ref 43.0–77.0)
Platelets: 216 10*3/uL (ref 150.0–400.0)
RBC: 4.15 Mil/uL (ref 3.87–5.11)
RDW: 13.5 % (ref 11.5–15.5)
WBC: 6 10*3/uL (ref 4.0–10.5)

## 2020-02-18 LAB — BASIC METABOLIC PANEL
BUN: 17 mg/dL (ref 6–23)
CO2: 31 mEq/L (ref 19–32)
Calcium: 9.2 mg/dL (ref 8.4–10.5)
Chloride: 102 mEq/L (ref 96–112)
Creatinine, Ser: 0.64 mg/dL (ref 0.40–1.20)
GFR: 88.22 mL/min (ref >60.00)
Glucose, Bld: 83 mg/dL (ref 70–99)
Potassium: 4.2 mEq/L (ref 3.5–5.1)
Sodium: 138 mEq/L (ref 135–145)

## 2020-02-18 LAB — TSH: TSH: 2.91 u[IU]/mL (ref 0.35–4.50)

## 2020-02-18 LAB — HEMOGLOBIN A1C: Hgb A1c MFr Bld: 5.5 % (ref 4.6–6.5)

## 2020-02-18 LAB — LIPID PANEL
Cholesterol: 135 mg/dL (ref 0–200)
HDL: 60.2 mg/dL (ref >39.00)
LDL Cholesterol: 59 mg/dL (ref 0–99)
NonHDL: 74.81
Total CHOL/HDL Ratio: 2
Triglycerides: 78 mg/dL (ref 0.0–149.0)
VLDL: 15.6 mg/dL (ref 0.0–40.0)

## 2020-02-18 LAB — VITAMIN B12: Vitamin B-12: 549 pg/mL (ref 211–911)

## 2020-02-18 LAB — VITAMIN D 25 HYDROXY (VIT D DEFICIENCY, FRACTURES): VITD: 37.45 ng/mL (ref 30.00–100.00)

## 2020-02-18 NOTE — Assessment & Plan Note (Signed)
To continue oral replacement 

## 2020-02-18 NOTE — Assessment & Plan Note (Signed)
stable overall by history and exam, recent data reviewed with pt, and pt to continue medical treatment as before,  to f/u any worsening symptoms or concerns  

## 2020-02-18 NOTE — Assessment & Plan Note (Signed)

## 2020-02-18 NOTE — Progress Notes (Signed)
Subjective:    Patient ID: Brittany Burgess, female    DOB: 06/04/1933, 86 y.o.   MRN: [id number]  HPI  Here for wellness and f/u;  Overall doing ok;  Pt denies Chest pain, worsening SOB, DOE, wheezing, orthopnea, PND, worsening LE edema, palpitations, dizziness or syncope.  Pt denies neurological change such as new headache, facial or extremity weakness.  Pt denies polydipsia, polyuria, or low sugar symptoms. Pt states overall good compliance with treatment and medications, good tolerability, and has been trying to follow appropriate diet.  Pt denies worsening depressive symptoms, suicidal ideation or panic. No fever, night sweats, wt loss, loss of appetite, or other constitutional symptoms.  Pt states good ability with ADL's, has low fall risk, home safety reviewed and adequate, no other significant changes in hearing or vision, and only occasionally active with exercise. No new complaints Past Medical History:  Diagnosis Date  . Allergic conjunctivitis of both eyes   . Allergic rhinitis   . Colon polyps   . Glaucoma   . Hypertension   . Osteopenia   . Vitamin D deficiency    Past Surgical History:  Procedure Laterality Date  . CATARACT EXTRACTION  2007  . FOOT SURGERY      reports that she has never smoked. She has never used smokeless tobacco. She reports that she does not drink alcohol or use drugs. family history includes Cancer in her father and mother; Diabetes in her father and mother. Allergies  Allergen Reactions  . Combigan [Brimonidine Tartrate-Timolol]     Red eyelids    Current Outpatient Medications on File Prior to Visit  Medication Sig Dispense Refill  . aspirin EC 81 MG tablet Take 1 tablet (81 mg total) by mouth daily. 90 tablet 11  . citalopram (CELEXA) 10 MG tablet Take 1 tablet (10 mg total) by mouth daily. 90 tablet 3  . cyanocobalamin 1000 MCG tablet Take 1,000 mcg by mouth daily.    Marland Kitchen latanoprost (XALATAN) 0.005 % ophthalmic solution 1 drop at bedtime.      . lovastatin (MEVACOR) 40 MG tablet Take 1 tablet (40 mg total) by mouth daily. Annual appt due in April must see provider for future refills 30 tablet 0  . timolol (BETIMOL) 0.5 % ophthalmic solution 1 drop 2 (two) times daily.    . travoprost, benzalkonium, (TRAVATAN) 0.004 % ophthalmic solution 1 drop at bedtime.     No current facility-administered medications on file prior to visit.   Review of Systems All otherwise neg per pt     Objective:   Physical Exam BP 130/84   Pulse 74   Temp 97.7 F (36.5 C)   Ht 5\' 2"  (1.575 m)   Wt 112 lb 12.8 oz (51.2 kg)   SpO2 100%   BMI 20.63 kg/m  VS noted,  Constitutional: Pt appears in NAD HENT: Head: NCAT.  Right Ear: External ear normal.  Left Ear: External ear normal.  Eyes: . Pupils are equal, round, and reactive to light. Conjunctivae and EOM are normal Nose: without d/c or deformity Neck: Neck supple. Gross normal ROM Cardiovascular: Normal rate and regular rhythm.   Pulmonary/Chest: Effort normal and breath sounds without rales or wheezing.  Abd:  Soft, NT, ND, + BS, no organomegaly Neurological: Pt is alert. At baseline orientation, motor grossly intact Skin: Skin is warm. No rashes, other new lesions, no LE edema Psychiatric: Pt behavior is normal without agitation  All otherwise neg per pt Lab Results  Component Value  Date   WBC 6.0 02/18/2020   HGB 12.7 02/18/2020   HCT 38.0 02/18/2020   PLT 216.0 02/18/2020   GLUCOSE 83 02/18/2020   CHOL 135 02/18/2020   TRIG 78.0 02/18/2020   HDL 60.20 02/18/2020   LDLCALC 59 02/18/2020   ALT 14 02/18/2020   AST 18 02/18/2020   NA 138 02/18/2020   K 4.2 02/18/2020   CL 102 02/18/2020   CREATININE 0.64 02/18/2020   BUN 17 02/18/2020   CO2 31 02/18/2020   TSH 2.91 02/18/2020   HGBA1C 5.5 02/18/2020          Assessment & Plan:

## 2020-02-18 NOTE — Patient Instructions (Signed)

## 2020-02-28 ENCOUNTER — Other Ambulatory Visit: Payer: Self-pay | Admitting: Internal Medicine

## 2020-02-28 NOTE — Telephone Encounter (Signed)
Please refill as per office routine med refill policy (all routine meds refilled for 3 mo or monthly per pt preference up to one year from last visit, then month to month grace period for 3 mo, then further med refills will have to be denied)  

## 2020-04-13 DIAGNOSIS — H401121 Primary open-angle glaucoma, left eye, mild stage: Secondary | ICD-10-CM | POA: Diagnosis not present

## 2020-04-13 DIAGNOSIS — H401112 Primary open-angle glaucoma, right eye, moderate stage: Secondary | ICD-10-CM | POA: Diagnosis not present

## 2020-09-06 ENCOUNTER — Other Ambulatory Visit: Payer: Self-pay | Admitting: Internal Medicine

## 2020-09-23 DIAGNOSIS — H401121 Primary open-angle glaucoma, left eye, mild stage: Secondary | ICD-10-CM | POA: Diagnosis not present

## 2020-09-23 DIAGNOSIS — H401112 Primary open-angle glaucoma, right eye, moderate stage: Secondary | ICD-10-CM | POA: Diagnosis not present

## 2021-01-28 ENCOUNTER — Other Ambulatory Visit: Payer: Self-pay | Admitting: Internal Medicine

## 2021-01-28 NOTE — Telephone Encounter (Signed)
Please refill as per office routine med refill policy (all routine meds refilled for 3 mo or monthly per pt preference up to one year from last visit, then month to month grace period for 3 mo, then further med refills will have to be denied)  

## 2021-02-18 ENCOUNTER — Encounter: Payer: Self-pay | Admitting: Internal Medicine

## 2021-02-18 ENCOUNTER — Other Ambulatory Visit: Payer: Self-pay

## 2021-02-18 ENCOUNTER — Ambulatory Visit (INDEPENDENT_AMBULATORY_CARE_PROVIDER_SITE_OTHER): Payer: PPO | Admitting: Internal Medicine

## 2021-02-18 ENCOUNTER — Encounter (INDEPENDENT_AMBULATORY_CARE_PROVIDER_SITE_OTHER): Payer: Self-pay

## 2021-02-18 VITALS — BP 200/90 | HR 75 | Ht 62.0 in | Wt 124.0 lb

## 2021-02-18 DIAGNOSIS — R739 Hyperglycemia, unspecified: Secondary | ICD-10-CM

## 2021-02-18 DIAGNOSIS — Z0000 Encounter for general adult medical examination without abnormal findings: Secondary | ICD-10-CM | POA: Diagnosis not present

## 2021-02-18 DIAGNOSIS — E785 Hyperlipidemia, unspecified: Secondary | ICD-10-CM | POA: Diagnosis not present

## 2021-02-18 DIAGNOSIS — E559 Vitamin D deficiency, unspecified: Secondary | ICD-10-CM

## 2021-02-18 DIAGNOSIS — I10 Essential (primary) hypertension: Secondary | ICD-10-CM | POA: Diagnosis not present

## 2021-02-18 DIAGNOSIS — E2839 Other primary ovarian failure: Secondary | ICD-10-CM

## 2021-02-18 DIAGNOSIS — Z23 Encounter for immunization: Secondary | ICD-10-CM

## 2021-02-18 LAB — CBC WITH DIFFERENTIAL/PLATELET
Basophils Absolute: 0 10*3/uL (ref 0.0–0.1)
Basophils Relative: 0.4 % (ref 0.0–3.0)
Eosinophils Absolute: 0.1 10*3/uL (ref 0.0–0.7)
Eosinophils Relative: 1.9 % (ref 0.0–5.0)
HCT: 39.7 % (ref 36.0–46.0)
Hemoglobin: 13.3 g/dL (ref 12.0–15.0)
Lymphocytes Relative: 33.3 % (ref 12.0–46.0)
Lymphs Abs: 2.1 10*3/uL (ref 0.7–4.0)
MCHC: 33.4 g/dL (ref 30.0–36.0)
MCV: 90.4 fl (ref 78.0–100.0)
Monocytes Absolute: 0.7 10*3/uL (ref 0.1–1.0)
Monocytes Relative: 11.5 % (ref 3.0–12.0)
Neutro Abs: 3.3 10*3/uL (ref 1.4–7.7)
Neutrophils Relative %: 52.9 % (ref 43.0–77.0)
Platelets: 236 10*3/uL (ref 150.0–400.0)
RBC: 4.4 Mil/uL (ref 3.87–5.11)
RDW: 13.4 % (ref 11.5–15.5)
WBC: 6.2 10*3/uL (ref 4.0–10.5)

## 2021-02-18 LAB — BASIC METABOLIC PANEL
BUN: 14 mg/dL (ref 6–23)
CO2: 31 mEq/L (ref 19–32)
Calcium: 9.4 mg/dL (ref 8.4–10.5)
Chloride: 104 mEq/L (ref 96–112)
Creatinine, Ser: 0.55 mg/dL (ref 0.40–1.20)
GFR: 83.32 mL/min (ref >60.00)
Glucose, Bld: 76 mg/dL (ref 70–99)
Potassium: 4.1 mEq/L (ref 3.5–5.1)
Sodium: 139 mEq/L (ref 135–145)

## 2021-02-18 LAB — VITAMIN D 25 HYDROXY (VIT D DEFICIENCY, FRACTURES): VITD: 31.65 ng/mL (ref 30.00–100.00)

## 2021-02-18 LAB — HEPATIC FUNCTION PANEL
ALT: 9 U/L (ref 0–35)
AST: 15 U/L (ref 0–37)
Albumin: 4.1 g/dL (ref 3.5–5.2)
Alkaline Phosphatase: 54 U/L (ref 39–117)
Bilirubin, Direct: 0.1 mg/dL (ref 0.0–0.3)
Total Bilirubin: 0.3 mg/dL (ref 0.2–1.2)
Total Protein: 6.9 g/dL (ref 6.0–8.3)

## 2021-02-18 LAB — URINALYSIS, ROUTINE W REFLEX MICROSCOPIC
Bilirubin Urine: NEGATIVE
Ketones, ur: NEGATIVE
Nitrite: NEGATIVE
Specific Gravity, Urine: 1.01 (ref 1.000–1.030)
Total Protein, Urine: NEGATIVE
Urine Glucose: NEGATIVE
Urobilinogen, UA: 0.2 (ref 0.0–1.0)
pH: 5.5 (ref 5.0–8.0)

## 2021-02-18 LAB — TSH: TSH: 3.04 u[IU]/mL (ref 0.35–4.50)

## 2021-02-18 LAB — LIPID PANEL
Cholesterol: 149 mg/dL (ref 0–200)
HDL: 60.1 mg/dL (ref >39.00)
LDL Cholesterol: 60 mg/dL (ref 0–99)
NonHDL: 89.34
Total CHOL/HDL Ratio: 2
Triglycerides: 148 mg/dL (ref 0.0–149.0)
VLDL: 29.6 mg/dL (ref 0.0–40.0)

## 2021-02-18 LAB — HEMOGLOBIN A1C: Hgb A1c MFr Bld: 5.5 % (ref 4.6–6.5)

## 2021-02-18 NOTE — Progress Notes (Signed)
Patient ID: Brittany Burgess, female   DOB: October 02, 1933, 87 y.o.   MRN: [id number]         Chief Complaint:: wellness exam and htn       HPI:  Brittany Burgess is a 87 y.o. female here for wellness exam; due for pneumovax; o/w up to date with preventive referrals and immunizations                        Also has elevated BP severe and is visibly shaky and nervous today, states BP at home is usually < 140/90.  Pt denies chest pain, increased sob or doe, wheezing, orthopnea, PND, increased LE swelling, palpitations, dizziness or syncope.   Pt denies polydipsia, polyuria, .Denies new worsening focal neuro s/s.   Pt denies fever, wt loss, night sweats, loss of appetite, or other constitutional symptoms  No other new complaints   Wt Readings from Last 3 Encounters:  02/18/21 124 lb (56.2 kg)  02/18/20 112 lb 12.8 oz (51.2 kg)  05/19/19 118 lb (53.5 kg)   BP Readings from Last 3 Encounters:  02/18/21 (!) 200/90  02/18/20 130/84  05/19/19 128/84   Immunization History  Administered Date(s) Administered  . Fluad Quad(high Dose 65+) 07/19/2019  . Influenza, High Dose Seasonal PF 08/05/2018  . Influenza-Unspecified 08/02/2017  . PFIZER Comirnaty(Gray Top)Covid-19 Tri-Sucrose Vaccine 03/15/2020, 04/08/2020  . Pneumococcal Conjugate-13 12/08/2015  . Pneumococcal Polysaccharide-23 02/18/2021  . Tdap 11/14/2011  . Zoster 11/13/2005   There are no preventive care reminders to display for this patient.  Past Medical History:  Diagnosis Date  . Allergic conjunctivitis of both eyes   . Allergic rhinitis   . Colon polyps   . Glaucoma   . Hypertension   . Osteopenia   . Vitamin D deficiency    Past Surgical History:  Procedure Laterality Date  . CATARACT EXTRACTION  2007  . FOOT SURGERY      reports that she has never smoked. She has never used smokeless tobacco. She reports that she does not drink alcohol and does not use drugs. family history includes Cancer in her father and mother;  Diabetes in her father and mother. Allergies  Allergen Reactions  . Combigan [Brimonidine Tartrate-Timolol]     Red eyelids    Current Outpatient Medications on File Prior to Visit  Medication Sig Dispense Refill  . aspirin EC 81 MG tablet Take 1 tablet (81 mg total) by mouth daily. 90 tablet 11  . citalopram (CELEXA) 10 MG tablet TAKE 1 TABLET BY MOUTH EVERY DAY 90 tablet 3  . lovastatin (MEVACOR) 40 MG tablet TAKE 1 TABLET BY MOUTH EVERYDAY AT BEDTIME 90 tablet 3  . timolol (BETIMOL) 0.5 % ophthalmic solution 1 drop 2 (two) times daily.    . cyanocobalamin 1000 MCG tablet Take 1,000 mcg by mouth daily. (Patient not taking: Reported on 02/18/2021)    . latanoprost (XALATAN) 0.005 % ophthalmic solution 1 drop at bedtime. (Patient not taking: Reported on 02/18/2021)    . travoprost, benzalkonium, (TRAVATAN) 0.004 % ophthalmic solution 1 drop at bedtime. (Patient not taking: Reported on 02/18/2021)     No current facility-administered medications on file prior to visit.        ROS:  All others reviewed and negative.  Objective        PE:  BP (!) 200/90 (BP Location: Left Arm, Patient Position: Sitting, Cuff Size: Normal)   Pulse 75   Ht 5\' 2"  (1.575 m)  Wt 124 lb (56.2 kg)   SpO2 98%   BMI 22.68 kg/m                 Constitutional: Pt appears in NAD               HENT: Head: NCAT.                Right Ear: External ear normal.                 Left Ear: External ear normal.                Eyes: . Pupils are equal, round, and reactive to light. Conjunctivae and EOM are normal               Nose: without d/c or deformity               Neck: Neck supple. Gross normal ROM               Cardiovascular: Normal rate and regular rhythm.                 Pulmonary/Chest: Effort normal and breath sounds without rales or wheezing.                Abd:  Soft, NT, ND, + BS, no organomegaly               Neurological: Pt is alert. At baseline orientation, motor grossly intact               Skin:  Skin is warm. No rashes, no other new lesions, LE edema - none               Psychiatric: Pt behavior is normal without agitation   Micro: none  Cardiac tracings I have personally interpreted today:  none  Pertinent Radiological findings (summarize): none   Lab Results  Component Value Date   WBC 6.2 02/18/2021   HGB 13.3 02/18/2021   HCT 39.7 02/18/2021   PLT 236.0 02/18/2021   GLUCOSE 76 02/18/2021   CHOL 149 02/18/2021   TRIG 148.0 02/18/2021   HDL 60.10 02/18/2021   LDLCALC 60 02/18/2021   ALT 9 02/18/2021   AST 15 02/18/2021   NA 139 02/18/2021   K 4.1 02/18/2021   CL 104 02/18/2021   CREATININE 0.55 02/18/2021   BUN 14 02/18/2021   CO2 31 02/18/2021   TSH 3.04 02/18/2021   HGBA1C 5.5 02/18/2021   Assessment/Plan:  Brittany Burgess is a 87 y.o. White or Caucasian [1] female with  has a past medical history of Allergic conjunctivitis of both eyes, Allergic rhinitis, Colon polyps, Glaucoma, Hypertension, Osteopenia, and Vitamin D deficiency.  Routine general medical examination at a health care facility Age and sex appropriate education and counseling updated with regular exercise and diet Referrals for preventative services - for DXA referral Immunizations addressed - for pneumovax Smoking counseling  - none needed Evidence for depression or other mood disorder - none significant Most recent labs reviewed. I have personally reviewed and have noted: 1) the patient's medical and social history 2) The patient's current medications and supplements 3) The patient's height, weight, and BMI have been recorded in the chart   Vitamin D deficiency Last vitamin D Lab Results  Component Value Date   VD25OH 31.65 02/18/2021   Low normal, to start oral replacement   Hypertension BP Readings from Last 3 Encounters:  02/18/21 (!) 200/90  02/18/20 130/84  05/19/19 128/84   Severe uncontrolled today, but pt adamant BP controleld at home, asympt,  pt declines new  antiHTN med   Hyperglycemia Lab Results  Component Value Date   HGBA1C 5.5 02/18/2021   Stable, pt to continue current medical treatment  - diet    HLD (hyperlipidemia) Lab Results  Component Value Date   LDLCALC 60 02/18/2021   Stable, pt to continue current statin  - lovastatin   Followup: Return in about 1 year (around 02/18/2022).  [name], MD 02/19/2021 5:22 AM [facility]

## 2021-02-18 NOTE — Patient Instructions (Addendum)
You had the Pneumovax pneumonia shot today  Please schedule the bone density test before leaving today at the scheduling desk (where you check out)  Please take OTC Vitamin D3 at 2000 units per day, indefinitely. Or take the [place] Plus D twice per day with meals  Please call in 10 days with your average blood pressure at home  Please continue all other medications as before, and refills have been done if requested.  Please have the pharmacy call with any other refills you may need.  Please continue your efforts at being more active, low cholesterol diet, and weight control.  You are otherwise up to date with prevention measures today.  Please keep your appointments with your specialists as you may have planned  Please go to the LAB at the blood drawing area for the tests to be done  You will be contacted by phone if any changes need to be made immediately.  Otherwise, you will receive a letter about your results with an explanation, but please check with MyChart first.  Please remember to sign up for MyChart if you have not done so, as this will be important to you in the future with finding out test results, communicating by private email, and scheduling acute appointments online when needed.  Please make an Appointment to return for your 1 year visit, or sooner if needed, with Lab testing by Appointment as well, to be done about 3-5 days before at the [facility] (so this is for TWO appointments - please see the scheduling desk as you leave)  Due to the ongoing Covid 19 pandemic, our lab now requires an appointment for any labs done at our office.  If you need labs done and do not have an appointment, please call our office ahead of time to schedule before presenting to the lab for your testing.

## 2021-02-19 ENCOUNTER — Encounter: Payer: Self-pay | Admitting: Internal Medicine

## 2021-02-19 NOTE — Assessment & Plan Note (Addendum)
Last vitamin D Lab Results  Component Value Date   VD25OH 31.65 02/18/2021   Low normal, to start oral replacement

## 2021-02-19 NOTE — Addendum Note (Signed)
Addended by: Biagio Borg on: 02/19/2021 05:23 AM   Modules accepted: Orders

## 2021-02-19 NOTE — Assessment & Plan Note (Addendum)
Age and sex appropriate education and counseling updated with regular exercise and diet Referrals for preventative services - for DXA referral Immunizations addressed - for pneumovax Smoking counseling  - none needed Evidence for depression or other mood disorder - none significant Most recent labs reviewed. I have personally reviewed and have noted: 1) the patient's medical and social history 2) The patient's current medications and supplements 3) The patient's height, weight, and BMI have been recorded in the chart

## 2021-02-19 NOTE — Assessment & Plan Note (Signed)
BP Readings from Last 3 Encounters:  02/18/21 (!) 200/90  02/18/20 130/84  05/19/19 128/84   Severe uncontrolled today, but pt adamant BP controleld at home, asympt,  pt declines new antiHTN med

## 2021-02-19 NOTE — Assessment & Plan Note (Signed)
Lab Results  Component Value Date   LDLCALC 60 02/18/2021   Stable, pt to continue current statin  - lovastatin

## 2021-02-19 NOTE — Assessment & Plan Note (Signed)
Lab Results  Component Value Date   HGBA1C 5.5 02/18/2021   Stable, pt to continue current medical treatment  - diet

## 2021-02-24 ENCOUNTER — Encounter: Payer: Self-pay | Admitting: Internal Medicine

## 2021-02-24 ENCOUNTER — Other Ambulatory Visit: Payer: Self-pay

## 2021-02-24 ENCOUNTER — Ambulatory Visit (INDEPENDENT_AMBULATORY_CARE_PROVIDER_SITE_OTHER)
Admission: RE | Admit: 2021-02-24 | Discharge: 2021-02-24 | Disposition: A | Discharge status: Home / Self Care | Payer: PPO | Source: Ambulatory Visit | Attending: Internal Medicine | Admitting: Internal Medicine

## 2021-02-24 DIAGNOSIS — E2839 Other primary ovarian failure: Secondary | ICD-10-CM | POA: Diagnosis not present

## 2021-03-09 DIAGNOSIS — H524 Presbyopia: Secondary | ICD-10-CM | POA: Diagnosis not present

## 2021-03-09 DIAGNOSIS — H401112 Primary open-angle glaucoma, right eye, moderate stage: Secondary | ICD-10-CM | POA: Diagnosis not present

## 2021-03-09 DIAGNOSIS — H401121 Primary open-angle glaucoma, left eye, mild stage: Secondary | ICD-10-CM | POA: Diagnosis not present

## 2021-07-13 DIAGNOSIS — H401121 Primary open-angle glaucoma, left eye, mild stage: Secondary | ICD-10-CM | POA: Diagnosis not present

## 2021-07-13 DIAGNOSIS — H02423 Myogenic ptosis of bilateral eyelids: Secondary | ICD-10-CM | POA: Diagnosis not present

## 2021-07-13 DIAGNOSIS — H401112 Primary open-angle glaucoma, right eye, moderate stage: Secondary | ICD-10-CM | POA: Diagnosis not present

## 2021-09-07 ENCOUNTER — Other Ambulatory Visit: Payer: Self-pay | Admitting: Internal Medicine

## 2021-10-24 DIAGNOSIS — H401121 Primary open-angle glaucoma, left eye, mild stage: Secondary | ICD-10-CM | POA: Diagnosis not present

## 2021-10-24 DIAGNOSIS — H0288B Meibomian gland dysfunction left eye, upper and lower eyelids: Secondary | ICD-10-CM | POA: Diagnosis not present

## 2021-10-24 DIAGNOSIS — H401112 Primary open-angle glaucoma, right eye, moderate stage: Secondary | ICD-10-CM | POA: Diagnosis not present

## 2022-01-18 ENCOUNTER — Encounter: Payer: Self-pay | Admitting: Internal Medicine

## 2022-01-18 NOTE — Progress Notes (Signed)
? ? ?Subjective:  ? ? Patient ID: Brittany Burgess, female    DOB: [birth year], 88 y.o.   MRN: [id number] ? ?This visit occurred during the SARS-CoV-2 public health emergency.  Safety protocols were in place, including screening questions prior to the visit, additional usage of staff PPE, and extensive cleaning of exam room while observing appropriate contact time as indicated for disinfecting solutions. ? ? ? ?HPI ?Brittany Burgess is here for  ?Chief Complaint  ?Patient presents with  ? Cough  ? ?She is here with her daughter who helps provide some history. ? ?Covid positive since 2/19   she had just a cough and more SOB than usual when she had COVID.  Persistent cough since then.  Her cough is dry.  The cough is day and night.  She is taking deslym cough syrup and that helps.  It has gotten a little better.  She is sleeping ok.  She may have a little more short of breath than usual when she goes for a walk or exerts herself moderately.  She is not a great historian. ? ?She denies any other side effects.  Her daughter states she has seen improvement in her symptoms, especially this past week, but just wanted to make sure everything was okay.  She is also more fatigued than usual. ? ? ? ? ? ?Medications and allergies reviewed with patient and updated if appropriate. ? ?Current Outpatient Medications on File Prior to Visit  ?Medication Sig Dispense Refill  ? aspirin EC 81 MG tablet Take 1 tablet (81 mg total) by mouth daily. 90 tablet 11  ? citalopram (CELEXA) 10 MG tablet TAKE 1 TABLET BY MOUTH EVERY DAY 90 tablet 1  ? latanoprost (XALATAN) 0.005 % ophthalmic solution 1 drop at bedtime.    ? lovastatin (MEVACOR) 40 MG tablet TAKE 1 TABLET BY MOUTH EVERYDAY AT BEDTIME 90 tablet 3  ? timolol (BETIMOL) 0.5 % ophthalmic solution 1 drop 2 (two) times daily.    ? travoprost, benzalkonium, (TRAVATAN) 0.004 % ophthalmic solution 1 drop at bedtime.    ? cyanocobalamin 1000 MCG tablet Take 1,000 mcg by mouth daily. (Patient not taking:  Reported on 01/19/2022)    ? timolol (TIMOPTIC) 0.5 % ophthalmic solution 1 drop every morning.    ? ?No current facility-administered medications on file prior to visit.  ? ? ?Review of Systems  ?Constitutional:  Positive for fatigue (a little). Negative for appetite change, chills and fever.  ?HENT:  Positive for postnasal drip (occ). Negative for congestion and sore throat.   ?Respiratory:  Positive for cough, chest tightness (tiny bit) and shortness of breath (mild with moderated exertion only). Negative for wheezing.   ?Cardiovascular:  Negative for chest pain and palpitations.  ?Neurological:  Negative for light-headedness and headaches.  ? ?   ?Objective:  ? ?Vitals:  ? 01/19/22 1017  ?BP: (!) 142/76  ?Pulse: 72  ?Temp: 97.9 ?F (36.6 ?C)  ?SpO2: 99%  ? ?BP Readings from Last 3 Encounters:  ?01/19/22 (!) 142/76  ?02/18/21 (!) 200/90  ?02/18/20 130/84  ? ?Wt Readings from Last 3 Encounters:  ?01/19/22 128 lb 12.8 oz (58.4 kg)  ?02/18/21 124 lb (56.2 kg)  ?02/18/20 112 lb 12.8 oz (51.2 kg)  ? ?Body mass index is 23.56 kg/m?. ? ?  ?Physical Exam ?Constitutional:   ?   General: She is not in acute distress. ?   Appearance: Normal appearance.  ?HENT:  ?   Head: Normocephalic and atraumatic.  ?Eyes:  ?  Conjunctiva/sclera: Conjunctivae normal.  ?Cardiovascular:  ?   Rate and Rhythm: Normal rate and regular rhythm.  ?   Heart sounds: Normal heart sounds. No murmur heard. ?Pulmonary:  ?   Effort: Pulmonary effort is normal. No respiratory distress.  ?   Breath sounds: Normal breath sounds. No wheezing.  ?Musculoskeletal:  ?   Cervical back: Neck supple.  ?   Right lower leg: No edema.  ?   Left lower leg: No edema.  ?Lymphadenopathy:  ?   Cervical: No cervical adenopathy.  ?Skin: ?   Findings: No rash.  ?Neurological:  ?   Mental Status: She is alert. Mental status is at baseline.  ?Psychiatric:     ?   Mood and Affect: Mood normal.     ?   Behavior: Behavior normal.  ? ?   ? ? ? ? ? ?Assessment & Plan:  ? ? ?Cough,  mild shortness of breath: ?Dry cough, mild shortness of breath with moderate exertion and fatigue since COVID 3 weeks ago ?Symptoms improving and are mild in nature ?We will get a chest x-ray today, but expected to be normal ?Exam is normal ?Continue symptomatic treatment-Delsym cough syrup ?She is eating well, sleeping well and going for her walks ?Has follow-up next month, but if her symptoms do not continue to improve slowly or if anything changes and follow-up sooner ? ? ? ? ? ? ?

## 2022-01-19 ENCOUNTER — Ambulatory Visit (INDEPENDENT_AMBULATORY_CARE_PROVIDER_SITE_OTHER): Payer: PPO

## 2022-01-19 ENCOUNTER — Other Ambulatory Visit: Payer: Self-pay

## 2022-01-19 ENCOUNTER — Ambulatory Visit (INDEPENDENT_AMBULATORY_CARE_PROVIDER_SITE_OTHER): Payer: PPO | Admitting: Internal Medicine

## 2022-01-19 VITALS — BP 142/76 | HR 72 | Temp 97.9°F | Ht 62.0 in | Wt 128.8 lb

## 2022-01-19 DIAGNOSIS — R059 Cough, unspecified: Secondary | ICD-10-CM | POA: Diagnosis not present

## 2022-01-19 DIAGNOSIS — R0602 Shortness of breath: Secondary | ICD-10-CM | POA: Diagnosis not present

## 2022-01-19 DIAGNOSIS — R052 Subacute cough: Secondary | ICD-10-CM

## 2022-01-19 NOTE — Patient Instructions (Addendum)
? ? ?  A chest xray was ordered.   ? ? ? ?Medications changes include :   none - continue delsym cough syrup ? ? ?

## 2022-02-07 ENCOUNTER — Ambulatory Visit: Payer: PPO

## 2022-02-21 ENCOUNTER — Ambulatory Visit (INDEPENDENT_AMBULATORY_CARE_PROVIDER_SITE_OTHER): Payer: PPO | Admitting: Internal Medicine

## 2022-02-21 ENCOUNTER — Encounter: Payer: Self-pay | Admitting: Internal Medicine

## 2022-02-21 VITALS — BP 122/70 | HR 79 | Temp 97.7°F | Ht 62.0 in | Wt 132.0 lb

## 2022-02-21 DIAGNOSIS — E559 Vitamin D deficiency, unspecified: Secondary | ICD-10-CM

## 2022-02-21 DIAGNOSIS — E785 Hyperlipidemia, unspecified: Secondary | ICD-10-CM | POA: Diagnosis not present

## 2022-02-21 DIAGNOSIS — E538 Deficiency of other specified B group vitamins: Secondary | ICD-10-CM

## 2022-02-21 DIAGNOSIS — H6123 Impacted cerumen, bilateral: Secondary | ICD-10-CM | POA: Diagnosis not present

## 2022-02-21 DIAGNOSIS — I10 Essential (primary) hypertension: Secondary | ICD-10-CM | POA: Diagnosis not present

## 2022-02-21 DIAGNOSIS — R739 Hyperglycemia, unspecified: Secondary | ICD-10-CM | POA: Diagnosis not present

## 2022-02-21 DIAGNOSIS — L989 Disorder of the skin and subcutaneous tissue, unspecified: Secondary | ICD-10-CM

## 2022-02-21 DIAGNOSIS — Z0000 Encounter for general adult medical examination without abnormal findings: Secondary | ICD-10-CM | POA: Diagnosis not present

## 2022-02-21 LAB — URINALYSIS, ROUTINE W REFLEX MICROSCOPIC
Bilirubin Urine: NEGATIVE
Ketones, ur: NEGATIVE
Leukocytes,Ua: NEGATIVE
Nitrite: NEGATIVE
Specific Gravity, Urine: >=1.03 — AB (ref 1.000–1.030)
Total Protein, Urine: NEGATIVE
Urine Glucose: NEGATIVE
Urobilinogen, UA: 0.2 (ref 0.0–1.0)
pH: 5.5 (ref 5.0–8.0)

## 2022-02-21 LAB — CBC WITH DIFFERENTIAL/PLATELET
Basophils Absolute: 0 10*3/uL (ref 0.0–0.1)
Basophils Relative: 0.5 % (ref 0.0–3.0)
Eosinophils Absolute: 0.1 10*3/uL (ref 0.0–0.7)
Eosinophils Relative: 1.8 % (ref 0.0–5.0)
HCT: 41.8 % (ref 36.0–46.0)
Hemoglobin: 13.8 g/dL (ref 12.0–15.0)
Lymphocytes Relative: 29.8 % (ref 12.0–46.0)
Lymphs Abs: 2.5 10*3/uL (ref 0.7–4.0)
MCHC: 33 g/dL (ref 30.0–36.0)
MCV: 91.2 fl (ref 78.0–100.0)
Monocytes Absolute: 0.9 10*3/uL (ref 0.1–1.0)
Monocytes Relative: 10.4 % (ref 3.0–12.0)
Neutro Abs: 4.7 10*3/uL (ref 1.4–7.7)
Neutrophils Relative %: 57.5 % (ref 43.0–77.0)
Platelets: 275 10*3/uL (ref 150.0–400.0)
RBC: 4.59 Mil/uL (ref 3.87–5.11)
RDW: 13.8 % (ref 11.5–15.5)
WBC: 8.2 10*3/uL (ref 4.0–10.5)

## 2022-02-21 LAB — BASIC METABOLIC PANEL
BUN: 14 mg/dL (ref 6–23)
CO2: 29 mEq/L (ref 19–32)
Calcium: 10 mg/dL (ref 8.4–10.5)
Chloride: 103 mEq/L (ref 96–112)
Creatinine, Ser: 0.64 mg/dL (ref 0.40–1.20)
GFR: 79.76 mL/min (ref >60.00)
Glucose, Bld: 89 mg/dL (ref 70–99)
Potassium: 4.1 mEq/L (ref 3.5–5.1)
Sodium: 140 mEq/L (ref 135–145)

## 2022-02-21 LAB — HEPATIC FUNCTION PANEL
ALT: 11 U/L (ref 0–35)
AST: 19 U/L (ref 0–37)
Albumin: 4.4 g/dL (ref 3.5–5.2)
Alkaline Phosphatase: 59 U/L (ref 39–117)
Bilirubin, Direct: 0.1 mg/dL (ref 0.0–0.3)
Total Bilirubin: 0.5 mg/dL (ref 0.2–1.2)
Total Protein: 7.5 g/dL (ref 6.0–8.3)

## 2022-02-21 LAB — LIPID PANEL
Cholesterol: 174 mg/dL (ref 0–200)
HDL: 61.2 mg/dL (ref >39.00)
LDL Cholesterol: 85 mg/dL (ref 0–99)
NonHDL: 112.58
Total CHOL/HDL Ratio: 3
Triglycerides: 140 mg/dL (ref 0.0–149.0)
VLDL: 28 mg/dL (ref 0.0–40.0)

## 2022-02-21 LAB — TSH: TSH: 4.8 u[IU]/mL (ref 0.35–5.50)

## 2022-02-21 LAB — VITAMIN B12: Vitamin B-12: 374 pg/mL (ref 211–911)

## 2022-02-21 LAB — VITAMIN D 25 HYDROXY (VIT D DEFICIENCY, FRACTURES): VITD: 35.29 ng/mL (ref 30.00–100.00)

## 2022-02-21 LAB — HEMOGLOBIN A1C: Hgb A1c MFr Bld: 5.8 % (ref 4.6–6.5)

## 2022-02-21 NOTE — Patient Instructions (Addendum)
You had the Tdap tetanus shot today ? ?Please consider going to the local pharmacy for the Shingles shot ? ?Your ears were cleared of wax today ? ?You will be contacted regarding the referral for: dermatology ? ?Please take OTC Vitamin D3 at 2000 units per day, indefinitely, as you are ? ?Please continue all other medications as before, and refills have been done if requested. ? ?Please have the pharmacy call with any other refills you may need. ? ?Please continue your efforts at being more active, low cholesterol diet, and weight control. ? ?You are otherwise up to date with prevention measures today. ? ?Please keep your appointments with your specialists as you may have planned ? ?Please go to the LAB at the blood drawing area for the tests to be done ? ?You will be contacted by phone if any changes need to be made immediately.  Otherwise, you will receive a letter about your results with an explanation, but please check with MyChart first. ? ?Please remember to sign up for MyChart if you have not done so, as this will be important to you in the future with finding out test results, communicating by private email, and scheduling acute appointments online when needed. ? ?Please make an Appointment to return for your 1 year visit, or sooner if needed ?

## 2022-02-21 NOTE — Assessment & Plan Note (Signed)
Last vitamin D ?Lab Results  ?Component Value Date  ? VD25OH 31.65 02/18/2021  ? ?Low, to start oral replacement ? ?

## 2022-02-21 NOTE — Progress Notes (Signed)
Patient ID: Brittany Burgess, female   DOB: [birth year], 88 y.o.   MRN: [id number] ? ? ? ?     Chief Complaint:: wellness exam and bilateral hearing reduced, left hand skin lesion, low vit d ? ?     HPI:  Brittany Burgess is a 88 y.o. female here for wellness exam; declines shingrix, covid booster, but for Tdap o/w up to date ?         ?              Also has decreased hearing for 1 wk, without fever, d/c. HA, tinnitus or vertigo.  Also has 2 moonset left post hand skin lesion, dark, slightly raised and not resolving.  Not taking Vit D.  Pt denies chest pain, increased sob or doe, wheezing, orthopnea, PND, increased LE swelling, palpitations, dizziness or syncope.   Pt denies polydipsia, polyuria, or new focal neuro s/s.    Pt denies fever, wt loss, night sweats, loss of appetite, or other constitutional symptoms  no other new complaints ?  ?Wt Readings from Last 3 Encounters:  ?02/21/22 132 lb (59.9 kg)  ?01/19/22 128 lb 12.8 oz (58.4 kg)  ?02/18/21 124 lb (56.2 kg)  ? ?BP Readings from Last 3 Encounters:  ?02/21/22 122/70  ?01/19/22 (!) 142/76  ?02/18/21 (!) 200/90  ? ?Immunization History  ?Administered Date(s) Administered  ? Fluad Quad(high Dose 65+) 07/19/2019  ? Influenza, High Dose Seasonal PF 08/05/2018  ? Influenza-Unspecified 08/02/2017  ? PFIZER Comirnaty(Gray Top)Covid-19 Tri-Sucrose Vaccine 03/15/2020, 04/08/2020  ? Pneumococcal Conjugate-13 12/08/2015  ? Pneumococcal Polysaccharide-23 02/18/2021  ? Tdap 11/14/2011  ? Zoster, Live 11/13/2005  ? ?There are no preventive care reminders to display for this patient. ? ?  ? ?Past Medical History:  ?Diagnosis Date  ? Allergic conjunctivitis of both eyes   ? Allergic rhinitis   ? Colon polyps   ? Glaucoma   ? Hypertension   ? Osteopenia   ? Vitamin D deficiency   ? ?Past Surgical History:  ?Procedure Laterality Date  ? CATARACT EXTRACTION  2007  ? FOOT SURGERY    ? ? reports that she has never smoked. She has never used smokeless tobacco. She reports that she  does not drink alcohol and does not use drugs. ?family history includes Cancer in her father and mother; Diabetes in her father and mother. ?Allergies  ?Allergen Reactions  ? Combigan [Brimonidine Tartrate-Timolol]   ?  Red eyelids ?  ? ?Current Outpatient Medications on File Prior to Visit  ?Medication Sig Dispense Refill  ? aspirin EC 81 MG tablet Take 1 tablet (81 mg total) by mouth daily. 90 tablet 11  ? citalopram (CELEXA) 10 MG tablet TAKE 1 TABLET BY MOUTH EVERY DAY 90 tablet 1  ? lovastatin (MEVACOR) 40 MG tablet TAKE 1 TABLET BY MOUTH EVERYDAY AT BEDTIME 90 tablet 3  ? timolol (TIMOPTIC) 0.5 % ophthalmic solution 1 drop every morning.    ? cyanocobalamin 1000 MCG tablet Take 1,000 mcg by mouth daily. (Patient not taking: Reported on 01/19/2022)    ? ?No current facility-administered medications on file prior to visit.  ? ?     ROS:  All others reviewed and negative. ? ?Objective  ? ?     PE:  BP 122/70 (BP Location: Right Arm, Patient Position: Sitting, Cuff Size: Normal)   Pulse 79   Temp 97.7 ?F (36.5 ?C) (Oral)   Ht '5\' 2"'$  (1.575 m)   Wt 132 lb (59.9 kg)  SpO2 98%   BMI 24.14 kg/m?  ? ?              Constitutional: Pt appears in NAD ?              HENT: Head: NCAT.  ?              Right Ear: External ear normal.   ?              Left Ear: External ear normal.  ?              Eyes: . Pupils are equal, round, and reactive to light. Conjunctivae and EOM are normal ?              Nose: without d/c or deformity ?              Neck: Neck supple. Gross normal ROM ?              Cardiovascular: Normal rate and regular rhythm.   ?              Pulmonary/Chest: Effort normal and breath sounds without rales or wheezing.  ?              Abd:  Soft, NT, ND, + BS, no organomegaly ?              Neurological: Pt is alert. At baseline orientation, motor grossly intact ?              Skin: Skin is warm. No rashes, no other new lesions, LE edema - none ?              Psychiatric: Pt behavior is normal without  agitation  ? ?Micro: none ? ?Cardiac tracings I have personally interpreted today:  none ? ?Pertinent Radiological findings (summarize): none  ? ?Lab Results  ?Component Value Date  ? WBC 8.2 02/21/2022  ? HGB 13.8 02/21/2022  ? HCT 41.8 02/21/2022  ? PLT 275.0 02/21/2022  ? GLUCOSE 89 02/21/2022  ? CHOL 174 02/21/2022  ? TRIG 140.0 02/21/2022  ? HDL 61.20 02/21/2022  ? [place] 85 02/21/2022  ? ALT 11 02/21/2022  ? AST 19 02/21/2022  ? NA 140 02/21/2022  ? K 4.1 02/21/2022  ? CL 103 02/21/2022  ? CREATININE 0.64 02/21/2022  ? BUN 14 02/21/2022  ? CO2 29 02/21/2022  ? TSH 4.80 02/21/2022  ? HGBA1C 5.8 02/21/2022  ? ?Assessment/Plan:  ?Brittany Burgess is a 88 y.o. White or Caucasian [1] female with  has a past medical history of Allergic conjunctivitis of both eyes, Allergic rhinitis, Colon polyps, Glaucoma, Hypertension, Osteopenia, and Vitamin D deficiency. ? ?Vitamin D deficiency ?Last vitamin D ?Lab Results  ?Component Value Date  ? VD25OH 31.65 02/18/2021  ? ?Low, to start oral replacement ? ? ?Routine general medical examination at a health care facility ?Age and sex appropriate education and counseling updated with regular exercise and diet ?Referrals for preventative services - none needed ?Immunizations addressed - declines shingrix, tdap, covid booster ?Smoking counseling  - none needed ?Evidence for depression or other mood disorder - none significant ?Most recent labs reviewed. ?I have personally reviewed and have noted: ?1) the patient's medical and social history ?2) The patient's current medications and supplements ?3) The patient's height, weight, and BMI have been recorded in the chart ? ? ?HLD (hyperlipidemia) ?Lab Results  ?Component Value Date  ? [place] 85 02/21/2022  ? ?Stable, pt to  continue current statin lovastatin ? ? ?Hyperglycemia ?Lab Results  ?Component Value Date  ? HGBA1C 5.8 02/21/2022  ? ?Stable, pt to continue current medical treatment  - diet ? ? ?Hypertension ?BP Readings from  Last 3 Encounters:  ?02/21/22 122/70  ?01/19/22 (!) 142/76  ?02/18/21 (!) 200/90  ? ?Stable, pt to continue medical treatment  - diet, wt control ? ?Bilateral impacted cerumen ?Improved hearing and wax resolved with irrigation ? ?Ceruminosis is noted.  Wax is removed by syringing and manual debridement. Instructions for home care to prevent wax buildup are given. ? ? ?Skin lesion of hand ?For derm referral ? ?Followup: Return in about 1 year (around 02/22/2023). ? ?[name], MD 02/26/2022 9:19 PM ?[place] [facility] ?[facility] ?

## 2022-02-21 NOTE — Progress Notes (Signed)
Patient consent obtained. ?Irrigation with water and peroxide performed on both ears. Full view of tympanic membranes after procedure.  ?Patient tolerated procedure well.  ? ?

## 2022-02-26 ENCOUNTER — Encounter: Payer: Self-pay | Admitting: Internal Medicine

## 2022-02-26 DIAGNOSIS — H6123 Impacted cerumen, bilateral: Secondary | ICD-10-CM | POA: Insufficient documentation

## 2022-02-26 DIAGNOSIS — L989 Disorder of the skin and subcutaneous tissue, unspecified: Secondary | ICD-10-CM | POA: Insufficient documentation

## 2022-02-26 NOTE — Assessment & Plan Note (Signed)
Improved hearing and wax resolved with irrigation ? ?Ceruminosis is noted.  Wax is removed by syringing and manual debridement. Instructions for home care to prevent wax buildup are given. ? ?

## 2022-02-26 NOTE — Assessment & Plan Note (Signed)
For derm referral ?

## 2022-02-26 NOTE — Assessment & Plan Note (Signed)
BP Readings from Last 3 Encounters:  ?02/21/22 122/70  ?01/19/22 (!) 142/76  ?02/18/21 (!) 200/90  ? ?Stable, pt to continue medical treatment  - diet, wt control ?

## 2022-02-26 NOTE — Assessment & Plan Note (Signed)
Age and sex appropriate education and counseling updated with regular exercise and diet ?Referrals for preventative services - none needed ?Immunizations addressed - declines shingrix, tdap, covid booster ?Smoking counseling  - none needed ?Evidence for depression or other mood disorder - none significant ?Most recent labs reviewed. ?I have personally reviewed and have noted: ?1) the patient's medical and social history ?2) The patient's current medications and supplements ?3) The patient's height, weight, and BMI have been recorded in the chart ? ?

## 2022-02-26 NOTE — Assessment & Plan Note (Signed)
Lab Results  ?Component Value Date  ? [place] 85 02/21/2022  ? ?Stable, pt to continue current statin lovastatin ? ?

## 2022-02-26 NOTE — Assessment & Plan Note (Signed)
Lab Results  ?Component Value Date  ? HGBA1C 5.8 02/21/2022  ? ?Stable, pt to continue current medical treatment  - diet ? ?

## 2022-02-27 DIAGNOSIS — D23112 Other benign neoplasm of skin of right lower eyelid, including canthus: Secondary | ICD-10-CM | POA: Diagnosis not present

## 2022-02-27 DIAGNOSIS — H401112 Primary open-angle glaucoma, right eye, moderate stage: Secondary | ICD-10-CM | POA: Diagnosis not present

## 2022-02-27 DIAGNOSIS — H02423 Myogenic ptosis of bilateral eyelids: Secondary | ICD-10-CM | POA: Diagnosis not present

## 2022-02-27 DIAGNOSIS — H401121 Primary open-angle glaucoma, left eye, mild stage: Secondary | ICD-10-CM | POA: Diagnosis not present

## 2022-02-28 ENCOUNTER — Telehealth: Payer: Self-pay | Admitting: Dermatology

## 2022-02-28 NOTE — Telephone Encounter (Signed)
Patient is calling for a referral appointment from [name], M.D.  Patient is scheduled for 10/24/2022 at 11:00 with Lavonna Monarch, M.D. ?

## 2022-02-28 NOTE — Telephone Encounter (Signed)
Referral attached to appointment

## 2022-04-09 ENCOUNTER — Other Ambulatory Visit: Payer: Self-pay | Admitting: Internal Medicine

## 2022-04-09 NOTE — Telephone Encounter (Signed)
Please refill as per office routine med refill policy (all routine meds to be refilled for 3 mo or monthly (per pt preference) up to one year from last visit, then month to month grace period for 3 mo, then further med refills will have to be denied) ? ?

## 2022-04-25 ENCOUNTER — Other Ambulatory Visit: Payer: Self-pay | Admitting: Internal Medicine

## 2022-06-07 ENCOUNTER — Ambulatory Visit (INDEPENDENT_AMBULATORY_CARE_PROVIDER_SITE_OTHER): Payer: PPO

## 2022-06-07 DIAGNOSIS — Z23 Encounter for immunization: Secondary | ICD-10-CM

## 2022-06-07 NOTE — Progress Notes (Addendum)
After obtaining consent, and per orders of Dr. Jenny Reichmann, injection of Shingrix given by Max Sane. Patient tolerated injection well in left deltoid and instructed to report any adverse reaction to me immediately.

## 2022-07-03 DIAGNOSIS — H401112 Primary open-angle glaucoma, right eye, moderate stage: Secondary | ICD-10-CM | POA: Diagnosis not present

## 2022-07-03 DIAGNOSIS — H401121 Primary open-angle glaucoma, left eye, mild stage: Secondary | ICD-10-CM | POA: Diagnosis not present

## 2022-08-14 DIAGNOSIS — H401121 Primary open-angle glaucoma, left eye, mild stage: Secondary | ICD-10-CM | POA: Diagnosis not present

## 2022-08-14 DIAGNOSIS — H401112 Primary open-angle glaucoma, right eye, moderate stage: Secondary | ICD-10-CM | POA: Diagnosis not present

## 2022-10-09 DIAGNOSIS — H401112 Primary open-angle glaucoma, right eye, moderate stage: Secondary | ICD-10-CM | POA: Diagnosis not present

## 2022-10-09 DIAGNOSIS — H401121 Primary open-angle glaucoma, left eye, mild stage: Secondary | ICD-10-CM | POA: Diagnosis not present

## 2022-10-24 ENCOUNTER — Ambulatory Visit: Payer: PPO | Admitting: Dermatology

## 2022-12-18 DIAGNOSIS — H401112 Primary open-angle glaucoma, right eye, moderate stage: Secondary | ICD-10-CM | POA: Diagnosis not present

## 2022-12-18 DIAGNOSIS — H401121 Primary open-angle glaucoma, left eye, mild stage: Secondary | ICD-10-CM | POA: Diagnosis not present

## 2023-01-01 IMAGING — DX DG CHEST 2V
2 series · 2 of 2 positions shown · non-contrast
Comparison: Chest x-ray 07/12/2006

CLINICAL DATA: Persistent cough.

EXAM:
CHEST - 2 VIEW

[chest pa]
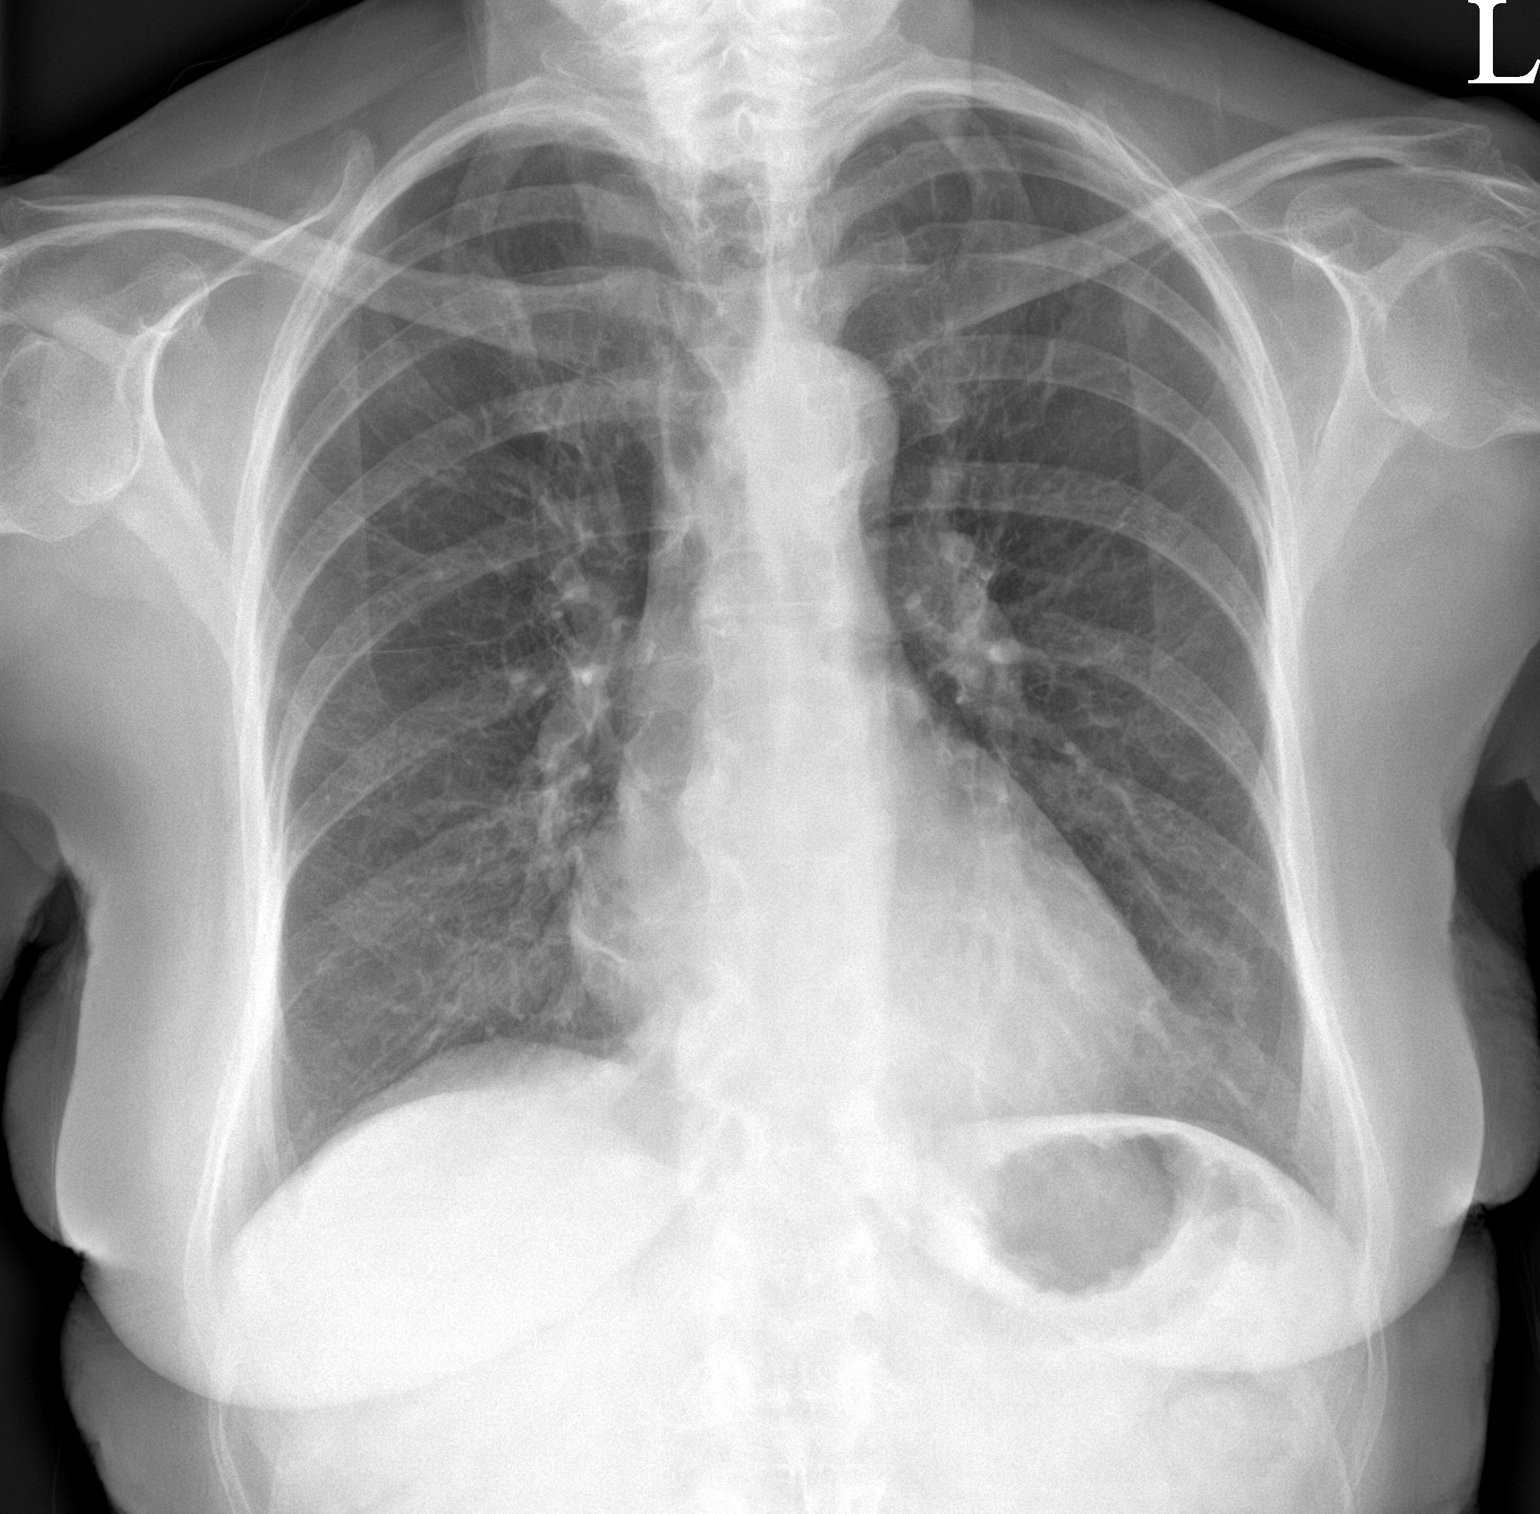

[chest lat]
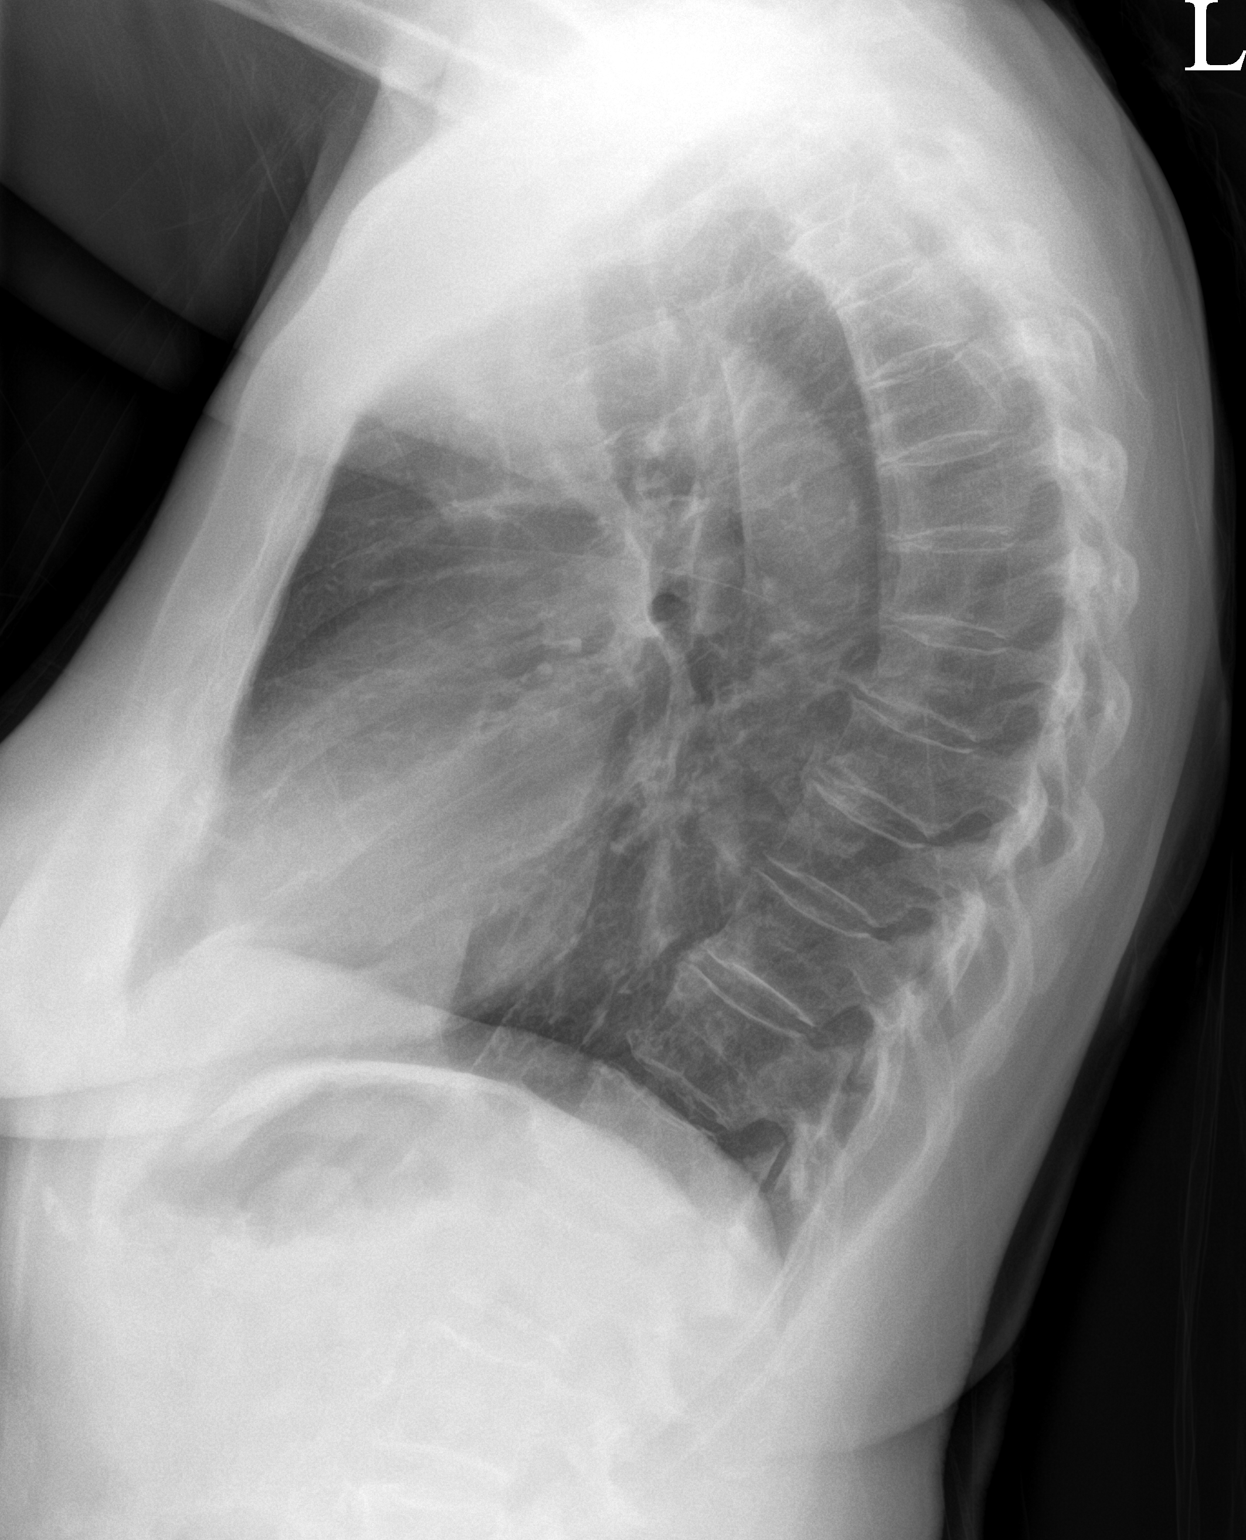

[2 of 2 positions shown; findings below may reference images not displayed]

FINDINGS: The heart size and mediastinal contours are within normal limits.
Both lungs are clear. The visualized skeletal structures are
unremarkable.
IMPRESSION: No active cardiopulmonary disease.

## 2023-02-26 DIAGNOSIS — H401112 Primary open-angle glaucoma, right eye, moderate stage: Secondary | ICD-10-CM | POA: Diagnosis not present

## 2023-02-26 DIAGNOSIS — H401121 Primary open-angle glaucoma, left eye, mild stage: Secondary | ICD-10-CM | POA: Diagnosis not present

## 2023-02-27 ENCOUNTER — Ambulatory Visit: Payer: Medicare HMO | Admitting: Internal Medicine

## 2023-02-27 ENCOUNTER — Encounter: Payer: Self-pay | Admitting: Internal Medicine

## 2023-02-27 VITALS — BP 118/70 | HR 67 | Temp 97.8°F | Ht 62.0 in | Wt 132.0 lb

## 2023-02-27 DIAGNOSIS — E559 Vitamin D deficiency, unspecified: Secondary | ICD-10-CM | POA: Diagnosis not present

## 2023-02-27 DIAGNOSIS — E538 Deficiency of other specified B group vitamins: Secondary | ICD-10-CM

## 2023-02-27 DIAGNOSIS — H6123 Impacted cerumen, bilateral: Secondary | ICD-10-CM | POA: Diagnosis not present

## 2023-02-27 DIAGNOSIS — R739 Hyperglycemia, unspecified: Secondary | ICD-10-CM | POA: Diagnosis not present

## 2023-02-27 DIAGNOSIS — Z0001 Encounter for general adult medical examination with abnormal findings: Secondary | ICD-10-CM | POA: Diagnosis not present

## 2023-02-27 DIAGNOSIS — E785 Hyperlipidemia, unspecified: Secondary | ICD-10-CM

## 2023-02-27 DIAGNOSIS — I10 Essential (primary) hypertension: Secondary | ICD-10-CM

## 2023-02-27 DIAGNOSIS — H6120 Impacted cerumen, unspecified ear: Secondary | ICD-10-CM | POA: Insufficient documentation

## 2023-02-27 LAB — HEPATIC FUNCTION PANEL
ALT: 10 U/L (ref 0–35)
AST: 17 U/L (ref 0–37)
Albumin: 4.4 g/dL (ref 3.5–5.2)
Alkaline Phosphatase: 67 U/L (ref 39–117)
Bilirubin, Direct: 0.1 mg/dL (ref 0.0–0.3)
Total Bilirubin: 0.3 mg/dL (ref 0.2–1.2)
Total Protein: 7.4 g/dL (ref 6.0–8.3)

## 2023-02-27 LAB — CBC WITH DIFFERENTIAL/PLATELET
Basophils Absolute: 0 10*3/uL (ref 0.0–0.1)
Basophils Relative: 0.5 % (ref 0.0–3.0)
Eosinophils Absolute: 0.1 10*3/uL (ref 0.0–0.7)
Eosinophils Relative: 1.1 % (ref 0.0–5.0)
HCT: 43 % (ref 36.0–46.0)
Hemoglobin: 14.3 g/dL (ref 12.0–15.0)
Lymphocytes Relative: 40.1 % (ref 12.0–46.0)
Lymphs Abs: 2.6 10*3/uL (ref 0.7–4.0)
MCHC: 33.1 g/dL (ref 30.0–36.0)
MCV: 90.6 fl (ref 78.0–100.0)
Monocytes Absolute: 0.8 10*3/uL (ref 0.1–1.0)
Monocytes Relative: 11.8 % (ref 3.0–12.0)
Neutro Abs: 3 10*3/uL (ref 1.4–7.7)
Neutrophils Relative %: 46.5 % (ref 43.0–77.0)
Platelets: 251 10*3/uL (ref 150.0–400.0)
RBC: 4.75 Mil/uL (ref 3.87–5.11)
RDW: 13.5 % (ref 11.5–15.5)
WBC: 6.4 10*3/uL (ref 4.0–10.5)

## 2023-02-27 LAB — URINALYSIS, ROUTINE W REFLEX MICROSCOPIC
Bilirubin Urine: NEGATIVE
Ketones, ur: NEGATIVE
Leukocytes,Ua: NEGATIVE
Nitrite: NEGATIVE
Specific Gravity, Urine: >=1.03 — AB (ref 1.000–1.030)
Total Protein, Urine: NEGATIVE
Urine Glucose: NEGATIVE
Urobilinogen, UA: 0.2 (ref 0.0–1.0)
pH: 5.5 (ref 5.0–8.0)

## 2023-02-27 LAB — TSH: TSH: 3.45 u[IU]/mL (ref 0.35–5.50)

## 2023-02-27 LAB — BASIC METABOLIC PANEL
BUN: 17 mg/dL (ref 6–23)
CO2: 30 mEq/L (ref 19–32)
Calcium: 9.8 mg/dL (ref 8.4–10.5)
Chloride: 103 mEq/L (ref 96–112)
Creatinine, Ser: 0.64 mg/dL (ref 0.40–1.20)
GFR: 79.2 mL/min (ref >60.00)
Glucose, Bld: 88 mg/dL (ref 70–99)
Potassium: 4.4 mEq/L (ref 3.5–5.1)
Sodium: 139 mEq/L (ref 135–145)

## 2023-02-27 LAB — LIPID PANEL
Cholesterol: 144 mg/dL (ref 0–200)
HDL: 55.2 mg/dL (ref >39.00)
LDL Cholesterol: 58 mg/dL (ref 0–99)
NonHDL: 88.8
Total CHOL/HDL Ratio: 3
Triglycerides: 155 mg/dL — ABNORMAL HIGH (ref 0.0–149.0)
VLDL: 31 mg/dL (ref 0.0–40.0)

## 2023-02-27 LAB — HEMOGLOBIN A1C: Hgb A1c MFr Bld: 5.6 % (ref 4.6–6.5)

## 2023-02-27 LAB — VITAMIN B12: Vitamin B-12: 348 pg/mL (ref 211–911)

## 2023-02-27 LAB — VITAMIN D 25 HYDROXY (VIT D DEFICIENCY, FRACTURES): VITD: 34.89 ng/mL (ref 30.00–100.00)

## 2023-02-27 NOTE — Assessment & Plan Note (Signed)
Lab Results  Component Value Date   LDLCALC 85 02/21/2022   Stable, pt to continue current statin lovastatin 40 mg qd

## 2023-02-27 NOTE — Assessment & Plan Note (Signed)
BP Readings from Last 3 Encounters:  02/27/23 118/70  02/21/22 122/70  01/19/22 (!) 142/76   Stable, pt to continue medical treatment  - diet, wt control

## 2023-02-27 NOTE — Assessment & Plan Note (Signed)
Ceruminosis is noted.  Wax is removed by syringing and manual debridement. Instructions for home care to prevent wax buildup are given.  

## 2023-02-27 NOTE — Assessment & Plan Note (Signed)
Lab Results  Component Value Date   HGBA1C 5.8 02/21/2022   Stable, pt to continue current medical treatment  - diet, wt control

## 2023-02-27 NOTE — Progress Notes (Signed)
Patient ID: Brittany Burgess, female   DOB: 23-May-1933, 89 y.o.   MRN: [id number]         Chief Complaint:: wellness exam and bilateral cerumen impaction, htn, hld, hyperglycemia, low vit d       HPI:  Brittany Burgess is a 89 y.o. female here for wellness exam; for tdap and shingrix at pharmacy, o/w up to date                        Also Pt denies chest pain, increased sob or doe, wheezing, orthopnea, PND, increased LE swelling, palpitations, dizziness or syncope.   Pt denies polydipsia, polyuria, or new focal neuro s/s.    Pt denies fever, wt loss, night sweats, loss of appetite, or other constitutional symptoms  Has muffled hearing for 1 wk without pain or d/c.     Wt Readings from Last 3 Encounters:  02/27/23 132 lb (59.9 kg)  02/21/22 132 lb (59.9 kg)  01/19/22 128 lb 12.8 oz (58.4 kg)   BP Readings from Last 3 Encounters:  02/27/23 118/70  02/21/22 122/70  01/19/22 (!) 142/76   Immunization History  Administered Date(s) Administered   Fluad Quad(high Dose 65+) 07/19/2019, 06/26/2022   Influenza, High Dose Seasonal PF 08/05/2018, 06/26/2022   Influenza-Unspecified 08/02/2017   PFIZER Comirnaty(Gray Top)Covid-19 Tri-Sucrose Vaccine 03/15/2020, 04/08/2020   Pneumococcal Conjugate-13 12/08/2015   Pneumococcal Polysaccharide-23 02/18/2021   Tdap 11/14/2011   Zoster Recombinat (Shingrix) 06/07/2022   Zoster, Live 11/13/2005   Health Maintenance Due  Topic Date Due   Medicare Annual Wellness (AWV)  Never done   DTaP/Tdap/Td (2 - Td or Tdap) 11/13/2021   Zoster Vaccines- Shingrix (2 of 2) 08/02/2022      Past Medical History:  Diagnosis Date   Allergic conjunctivitis of both eyes    Allergic rhinitis    Colon polyps    Glaucoma    Hypertension    Osteopenia    Vitamin D deficiency    Past Surgical History:  Procedure Laterality Date   CATARACT EXTRACTION  2007   FOOT SURGERY      reports that she has never smoked. She has never used smokeless tobacco. She reports  that she does not drink alcohol and does not use drugs. family history includes Cancer in her father and mother; Diabetes in her father and mother. Allergies  Allergen Reactions   Combigan [Brimonidine Tartrate-Timolol]     Red eyelids    Current Outpatient Medications on File Prior to Visit  Medication Sig Dispense Refill   aspirin EC 81 MG tablet Take 1 tablet (81 mg total) by mouth daily. 90 tablet 11   citalopram (CELEXA) 10 MG tablet TAKE 1 TABLET BY MOUTH EVERY DAY 90 tablet 3   lovastatin (MEVACOR) 40 MG tablet TAKE 1 TABLET BY MOUTH EVERYDAY AT BEDTIME 90 tablet 3   timolol (TIMOPTIC) 0.5 % ophthalmic solution 1 drop every morning.     cyanocobalamin 1000 MCG tablet Take 1,000 mcg by mouth daily. (Patient not taking: Reported on 01/19/2022)     No current facility-administered medications on file prior to visit.        ROS:  All others reviewed and negative.  Objective        PE:  BP 118/70 (BP Location: Right Arm, Patient Position: Sitting, Cuff Size: Normal)   Pulse 67   Temp 97.8 F (36.6 C) (Oral)   Ht 5\' 2"  (1.575 m)   Wt 132 lb (59.9  kg)   SpO2 99%   BMI 24.14 kg/m                 Constitutional: Pt appears in NAD               HENT: Head: NCAT.                Right Ear: External ear normal.                 Left Ear: External ear normal.                Eyes: . Pupils are equal, round, and reactive to light. Conjunctivae and EOM are normal               Nose: without d/c or deformity               Neck: Neck supple. Gross normal ROM               Cardiovascular: Normal rate and regular rhythm.                 Pulmonary/Chest: Effort normal and breath sounds without rales or wheezing.                Abd:  Soft, NT, ND, + BS, no organomegaly               Neurological: Pt is alert. At baseline orientation, motor grossly intact               Skin: Skin is warm. No rashes, no other new lesions, LE edema - none               Psychiatric: Pt behavior is normal without  agitation   Micro: none  Cardiac tracings I have personally interpreted today:  none  Pertinent Radiological findings (summarize): none   Lab Results  Component Value Date   WBC 8.2 02/21/2022   HGB 13.8 02/21/2022   HCT 41.8 02/21/2022   PLT 275.0 02/21/2022   GLUCOSE 89 02/21/2022   CHOL 174 02/21/2022   TRIG 140.0 02/21/2022   HDL 61.20 02/21/2022   LDLCALC 85 02/21/2022   ALT 11 02/21/2022   AST 19 02/21/2022   NA 140 02/21/2022   K 4.1 02/21/2022   CL 103 02/21/2022   CREATININE 0.64 02/21/2022   BUN 14 02/21/2022   CO2 29 02/21/2022   TSH 4.80 02/21/2022   HGBA1C 5.8 02/21/2022   Assessment/Plan:  Brittany Burgess is a 89 y.o. White or Caucasian [1] female with  has a past medical history of Allergic conjunctivitis of both eyes, Allergic rhinitis, Colon polyps, Glaucoma, Hypertension, Osteopenia, and Vitamin D deficiency.  Vitamin D deficiency Last vitamin D Lab Results  Component Value Date   VD25OH 35.29 02/21/2022   Low, to start oral replacement   Encounter for well adult exam with abnormal findings Age and sex appropriate education and counseling updated with regular exercise and diet Referrals for preventative services - none needed Immunizations addressed - for tdap and shingrix at pharmacy Smoking counseling  - none needed Evidence for depression or other mood disorder - none significant Most recent labs reviewed. I have personally reviewed and have noted: 1) the patient's medical and social history 2) The patient's current medications and supplements 3) The patient's height, weight, and BMI have been recorded in the chart   Cerumen impaction Ceruminosis is noted.  Wax is removed by syringing and manual debridement. Instructions  for home care to prevent wax buildup are given.  HLD (hyperlipidemia) Lab Results  Component Value Date   LDLCALC 85 02/21/2022   Stable, pt to continue current statin lovastatin 40 mg qd   Hyperglycemia Lab  Results  Component Value Date   HGBA1C 5.8 02/21/2022   Stable, pt to continue current medical treatment  - diet, wt control  Hypertension BP Readings from Last 3 Encounters:  02/27/23 118/70  02/21/22 122/70  01/19/22 (!) 142/76   Stable, pt to continue medical treatment  - diet, wt control  Followup: Return in about 1 year (around 02/27/2024).  Oliver Barre, MD 02/27/2023 1:04 PM [facility]

## 2023-02-27 NOTE — Progress Notes (Signed)
The test results show that your current treatment is OK, as the tests are stable.  Please continue the same plan.  There is no other need for change of treatment or further evaluation based on these results, at this time.  thanks 

## 2023-02-27 NOTE — Assessment & Plan Note (Signed)
Last vitamin D Lab Results  Component Value Date   VD25OH 35.29 02/21/2022   Low, to start oral replacement

## 2023-02-27 NOTE — Assessment & Plan Note (Signed)
Age and sex appropriate education and counseling updated with regular exercise and diet Referrals for preventative services - none needed Immunizations addressed - for tdap and shingrix at pharmacy Smoking counseling  - none needed Evidence for depression or other mood disorder - none significant Most recent labs reviewed. I have personally reviewed and have noted: 1) the patient's medical and social history 2) The patient's current medications and supplements 3) The patient's height, weight, and BMI have been recorded in the chart  

## 2023-02-27 NOTE — Patient Instructions (Addendum)
Please have your Shingrix (shingles) shots done at your local pharmacy., and the Tdap tetanus soht  Your ears were irrigated of wax today  Please continue all other medications as before, and refills have been done if requested.  Please have the pharmacy call with any other refills you may need.  Please continue your efforts at being more active, low cholesterol diet, and weight control.  You are otherwise up to date with prevention measures today.  Please keep your appointments with your specialists as you may have planned  Please go to the LAB at the blood drawing area for the tests to be done  You will be contacted by phone if any changes need to be made immediately.  Otherwise, you will receive a letter about your results with an explanation, but please check with MyChart first.  Please remember to sign up for MyChart if you have not done so, as this will be important to you in the future with finding out test results, communicating by private email, and scheduling acute appointments online when needed.  Please make an Appointment to return for your 1 year visit, or sooner if needed

## 2023-03-30 ENCOUNTER — Other Ambulatory Visit: Payer: Self-pay | Admitting: Internal Medicine

## 2023-04-26 ENCOUNTER — Other Ambulatory Visit: Payer: Self-pay | Admitting: Internal Medicine

## 2023-07-02 DIAGNOSIS — H401112 Primary open-angle glaucoma, right eye, moderate stage: Secondary | ICD-10-CM | POA: Diagnosis not present

## 2023-07-02 DIAGNOSIS — H02423 Myogenic ptosis of bilateral eyelids: Secondary | ICD-10-CM | POA: Diagnosis not present

## 2023-07-02 DIAGNOSIS — H401121 Primary open-angle glaucoma, left eye, mild stage: Secondary | ICD-10-CM | POA: Diagnosis not present

## 2023-07-02 DIAGNOSIS — H0288B Meibomian gland dysfunction left eye, upper and lower eyelids: Secondary | ICD-10-CM | POA: Diagnosis not present

## 2023-10-31 DIAGNOSIS — H401121 Primary open-angle glaucoma, left eye, mild stage: Secondary | ICD-10-CM | POA: Diagnosis not present

## 2023-10-31 DIAGNOSIS — H401112 Primary open-angle glaucoma, right eye, moderate stage: Secondary | ICD-10-CM | POA: Diagnosis not present

## 2024-01-08 ENCOUNTER — Encounter: Payer: Self-pay | Admitting: Internal Medicine

## 2024-01-08 ENCOUNTER — Ambulatory Visit: Payer: No Typology Code available for payment source | Admitting: Internal Medicine

## 2024-01-08 VITALS — BP 194/76 | HR 89 | Temp 97.8°F | Ht 62.0 in | Wt 138.2 lb

## 2024-01-08 DIAGNOSIS — R739 Hyperglycemia, unspecified: Secondary | ICD-10-CM | POA: Diagnosis not present

## 2024-01-08 DIAGNOSIS — E782 Mixed hyperlipidemia: Secondary | ICD-10-CM

## 2024-01-08 DIAGNOSIS — R7989 Other specified abnormal findings of blood chemistry: Secondary | ICD-10-CM | POA: Insufficient documentation

## 2024-01-08 DIAGNOSIS — N3000 Acute cystitis without hematuria: Secondary | ICD-10-CM | POA: Diagnosis not present

## 2024-01-08 DIAGNOSIS — I160 Hypertensive urgency: Secondary | ICD-10-CM | POA: Diagnosis not present

## 2024-01-08 DIAGNOSIS — R8281 Pyuria: Secondary | ICD-10-CM | POA: Diagnosis not present

## 2024-01-08 DIAGNOSIS — R0609 Other forms of dyspnea: Secondary | ICD-10-CM | POA: Diagnosis not present

## 2024-01-08 DIAGNOSIS — I10 Essential (primary) hypertension: Secondary | ICD-10-CM

## 2024-01-08 DIAGNOSIS — Z23 Encounter for immunization: Secondary | ICD-10-CM

## 2024-01-08 LAB — TROPONIN I (HIGH SENSITIVITY): High Sens Troponin I: 6 ng/L (ref 2–17)

## 2024-01-08 LAB — CBC WITH DIFFERENTIAL/PLATELET
Basophils Absolute: 0 10*3/uL (ref 0.0–0.1)
Basophils Relative: 0.6 % (ref 0.0–3.0)
Eosinophils Absolute: 0.1 10*3/uL (ref 0.0–0.7)
Eosinophils Relative: 1.8 % (ref 0.0–5.0)
HCT: 40.8 % (ref 36.0–46.0)
Hemoglobin: 13.4 g/dL (ref 12.0–15.0)
Lymphocytes Relative: 31.3 % (ref 12.0–46.0)
Lymphs Abs: 1.9 10*3/uL (ref 0.7–4.0)
MCHC: 32.7 g/dL (ref 30.0–36.0)
MCV: 91.7 fl (ref 78.0–100.0)
Monocytes Absolute: 0.5 10*3/uL (ref 0.1–1.0)
Monocytes Relative: 8.6 % (ref 3.0–12.0)
Neutro Abs: 3.5 10*3/uL (ref 1.4–7.7)
Neutrophils Relative %: 57.7 % (ref 43.0–77.0)
Platelets: 252 10*3/uL (ref 150.0–400.0)
RBC: 4.45 Mil/uL (ref 3.87–5.11)
RDW: 13.7 % (ref 11.5–15.5)
WBC: 6 10*3/uL (ref 4.0–10.5)

## 2024-01-08 LAB — HEPATIC FUNCTION PANEL
ALT: 9 U/L (ref 0–35)
AST: 16 U/L (ref 0–37)
Albumin: 4 g/dL (ref 3.5–5.2)
Alkaline Phosphatase: 61 U/L (ref 39–117)
Bilirubin, Direct: 0 mg/dL (ref 0.0–0.3)
Total Bilirubin: 0.3 mg/dL (ref 0.2–1.2)
Total Protein: 7 g/dL (ref 6.0–8.3)

## 2024-01-08 LAB — TSH: TSH: 3.12 u[IU]/mL (ref 0.35–5.50)

## 2024-01-08 LAB — BASIC METABOLIC PANEL
BUN: 17 mg/dL (ref 6–23)
CO2: 28 meq/L (ref 19–32)
Calcium: 9.1 mg/dL (ref 8.4–10.5)
Chloride: 107 meq/L (ref 96–112)
Creatinine, Ser: 0.81 mg/dL (ref 0.40–1.20)
GFR: 64.66 mL/min (ref >60.00)
Glucose, Bld: 147 mg/dL — ABNORMAL HIGH (ref 70–99)
Potassium: 3.7 meq/L (ref 3.5–5.1)
Sodium: 142 meq/L (ref 135–145)

## 2024-01-08 LAB — BRAIN NATRIURETIC PEPTIDE: Pro B Natriuretic peptide (BNP): 196 pg/mL — ABNORMAL HIGH (ref 0.0–100.0)

## 2024-01-08 MED ORDER — TRIAMTERENE-HCTZ 37.5-25 MG PO CAPS: 1.0000 | ORAL_CAPSULE | Freq: Every day | ORAL | 0 refills | Status: DC

## 2024-01-08 MED ORDER — AMLODIPINE BESYLATE 5 MG PO TABS: 5.0000 mg | ORAL_TABLET | Freq: Every day | ORAL | 0 refills | Status: DC

## 2024-01-08 NOTE — Progress Notes (Signed)
 Subjective:  Patient ID: Brittany Burgess, female    DOB: November 20, 1932  Age: 89 y.o. MRN: [id number]  CC: Hypertension and Hyperlipidemia   HPI Brittany Burgess presents for f/up -----  Discussed the use of AI scribe software for clinical note transcription with the patient, who gave verbal consent to proceed.  History of Present Illness   Brittany Burgess is an 89 year old female who presents with elevated blood pressure and exertional shortness of breath. She is accompanied by her daughter, Brittany Burgess, who lives nearby.  She denies headaches, blurred vision, or chest pain. She has not been on any medication for hypertension for a long time and has never been treated for it.  She experiences shortness of breath when walking uphill, requiring her to slow down and occasionally stop to catch her breath. No shortness of breath at rest. No swelling in her legs or feet.  She manages her own home, including cooking, cleaning, and personal care, although she mentions not cooking as much as she used to. No recent changes in weight, noting that she still wears the same size clothes as she did four years ago. She has not been forgetful lately and is generally able to manage her daily activities.       Outpatient Medications Prior to Visit  Medication Sig Dispense Refill   aspirin EC 81 MG tablet Take 1 tablet (81 mg total) by mouth daily. 90 tablet 11   dorzolamide-timolol (COSOPT) 2-0.5 % ophthalmic solution Place 1 drop into both eyes 2 (two) times daily.     lovastatin (MEVACOR) 40 MG tablet TAKE 1 TABLET BY MOUTH EVERYDAY AT BEDTIME 90 tablet 3   timolol (TIMOPTIC) 0.5 % ophthalmic solution 1 drop every morning.     citalopram (CELEXA) 10 MG tablet TAKE 1 TABLET BY MOUTH EVERY DAY 90 tablet 2   cyanocobalamin 1000 MCG tablet Take 1,000 mcg by mouth daily.     No facility-administered medications prior to visit.    ROS Review of Systems  Constitutional: Negative.  Negative for appetite  change, diaphoresis, fatigue and unexpected weight change.  HENT: Negative.    Eyes: Negative.  Negative for visual disturbance.  Respiratory:  Positive for shortness of breath. Negative for cough, chest tightness and wheezing.   Cardiovascular:  Negative for chest pain, palpitations and leg swelling.  Gastrointestinal:  Negative for abdominal pain, constipation, diarrhea, nausea and vomiting.  Endocrine: Negative.   Genitourinary: Negative.  Negative for difficulty urinating, dysuria, hematuria and urgency.  Musculoskeletal: Negative.  Negative for arthralgias, joint swelling and myalgias.  Skin: Negative.   Neurological: Negative.  Negative for dizziness and light-headedness.  Hematological:  Negative for adenopathy. Does not bruise/bleed easily.  Psychiatric/Behavioral:  Positive for confusion and decreased concentration. The patient is not nervous/anxious.     Objective:  BP (!) 194/76 (BP Location: Left Arm, Patient Position: Sitting, Cuff Size: Normal)   Pulse 89   Temp 97.8 F (36.6 C) (Oral)   Ht 5\' 2"  (1.575 m)   Wt 138 lb 3.2 oz (62.7 kg)   SpO2 99%   BMI 25.28 kg/m   BP Readings from Last 3 Encounters:  01/08/24 (!) 194/76  02/27/23 118/70  02/21/22 122/70    Wt Readings from Last 3 Encounters:  01/08/24 138 lb 3.2 oz (62.7 kg)  02/27/23 132 lb (59.9 kg)  02/21/22 132 lb (59.9 kg)    Physical Exam Vitals reviewed.  Constitutional:      Appearance: Normal appearance.  HENT:  Nose: Nose normal.     Mouth/Throat:     Mouth: Mucous membranes are moist.  Eyes:     General: No scleral icterus.    Conjunctiva/sclera: Conjunctivae normal.  Cardiovascular:     Rate and Rhythm: Normal rate and regular rhythm.     Heart sounds: Normal heart sounds, S1 normal and S2 normal.     Comments: EKG-- NSR, 86 bpm LAFB No LVH Anterior infarct pattern is old unchanged Pulmonary:     Effort: Pulmonary effort is normal.     Breath sounds: No stridor. No wheezing,  rhonchi or rales.  Abdominal:     General: Abdomen is flat.     Palpations: There is no mass.     Tenderness: There is no abdominal tenderness. There is no guarding.     Hernia: No hernia is present.  Musculoskeletal:     Right lower leg: No edema.     Left lower leg: No edema.  Skin:    General: Skin is warm and dry.     Findings: No rash.  Neurological:     General: No focal deficit present.     Mental Status: She is alert. Mental status is at baseline.  Psychiatric:        Mood and Affect: Mood normal.        Behavior: Behavior normal.     Lab Results  Component Value Date   WBC 6.0 01/08/2024   HGB 13.4 01/08/2024   HCT 40.8 01/08/2024   PLT 252.0 01/08/2024   GLUCOSE 147 (H) 01/08/2024   CHOL 144 02/27/2023   TRIG 155.0 (H) 02/27/2023   HDL 55.20 02/27/2023   LDLCALC 58 02/27/2023   ALT 9 01/08/2024   AST 16 01/08/2024   NA 142 01/08/2024   K 3.7 01/08/2024   CL 107 01/08/2024   CREATININE 0.81 01/08/2024   BUN 17 01/08/2024   CO2 28 01/08/2024   TSH 3.12 01/08/2024   HGBA1C 5.7 01/09/2024    DG Bone Density Result Date: 02/24/2021 Date of study: 02/24/2021 Exam: DUAL X-RAY ABSORPTIOMETRY (DXA) FOR BONE MINERAL DENSITY (BMD) Instrument: [employer] Requesting Provider: PCP Indication: follow up for low BMD Comparison: 2018 Clinical data: Pt is a 89 y.o. female without previous history of fracture. Results:  Lumbar spine L1-L4 Femoral neck (FN) T-score -0.2 RFN: -2.0 LFN: -2.2 Change in BMD from previous DXA test (%) n/a Down 4.9%* (*) statistically significant Assessment: By the [facility] Criteria for diagnosis based on bone density, this patient has Low Bone Density Z Score compares the patients bone density to age, sex, and race matched controls.  Compared to age, sex, and race matched controls, this patient's bone density is above average FRAX 10-year fracture risk calculator: 15.4 % for any major fracture and 5.3 % for hip fracture.  Pharmacologic therapy is  recommended if 10 year fracture risk is >20% for any major osteoporotic fracture or >3% for hip fracture.  Comments: the technical quality of the study is good. WHO criteria for diagnosis of osteoporosis in postmenopausal women and in men 29 y/o or older: - normal: T-score -1.0 to + 1.0 - osteopenia/low bone density: T-score between -2.5 and -1.0 - osteoporosis: T-score below -2.5 - severe osteoporosis: T-score below -2.5 with history of fragility fracture Note: although not part of the WHO classification, the presence of a fragility fracture, regardless of the T-score, should be considered diagnostic of osteoporosis, provided other causes for the fracture have been excluded.  Follow up BMD  is recommended: 2 years. Interpreted by : Lyndle Herrlich, MD [facility]    Assessment & Plan:  Need for immunization against influenza -     Flu Vaccine Trivalent High Dose (Fluad)  Primary hypertension- Will check labs to screen for secondary causes and endorgan damage.  Will start the combination of Dyazide and amlodipine to control the blood pressure. -     Urinalysis, Routine w reflex microscopic; Future -     Basic metabolic panel; Future -     CBC with Differential/Platelet; Future -     Aldosterone + renin activity w/ ratio; Future -     EKG 12-Lead  Mixed hyperlipidemia -     TSH; Future -     Hepatic function panel; Future  DOE (dyspnea on exertion) -     Troponin I (High Sensitivity); Future -     Brain natriuretic peptide; Future -     EKG 12-Lead -     ECHOCARDIOGRAM COMPLETE; Future  Elevated brain natriuretic peptide (BNP) level -     ECHOCARDIOGRAM COMPLETE; Future  Hyperglycemia -     Hemoglobin A1c; Future  Hypertensive urgency, malignant -     Triamterene-HCTZ; Take 1 each (1 capsule total) by mouth daily.  Dispense: 30 capsule; Refill: 0 -     amLODIPine Besylate; Take 1 tablet (5 mg total) by mouth daily.  Dispense: 30 tablet; Refill: 0  Pyuria -      CULTURE, URINE COMPREHENSIVE; Future -     Sulfamethoxazole-Trimethoprim; Take 1 tablet by mouth 2 (two) times daily for 3 days.  Dispense: 6 tablet; Refill: 0  Acute cystitis without hematuria -     Sulfamethoxazole-Trimethoprim; Take 1 tablet by mouth 2 (two) times daily for 3 days.  Dispense: 6 tablet; Refill: 0      Follow-up: Return in about 4 weeks (around 02/05/2024).  Sanda Linger, MD

## 2024-01-08 NOTE — Patient Instructions (Signed)
 Hypertension, Adult High blood pressure (hypertension) is when the force of blood pumping through the arteries is too strong. The arteries are the blood vessels that carry blood from the heart throughout the body. Hypertension forces the heart to work harder to pump blood and may cause arteries to become narrow or stiff. Untreated or uncontrolled hypertension can lead to a heart attack, heart failure, a stroke, kidney disease, and other problems. A blood pressure reading consists of a higher number over a lower number. Ideally, your blood pressure should be below 120/80. The first ("top") number is called the systolic pressure. It is a measure of the pressure in your arteries as your heart beats. The second ("bottom") number is called the diastolic pressure. It is a measure of the pressure in your arteries as the heart relaxes. What are the causes? The exact cause of this condition is not known. There are some conditions that result in high blood pressure. What increases the risk? Certain factors may make you more likely to develop high blood pressure. Some of these risk factors are under your control, including: Smoking. Not getting enough exercise or physical activity. Being overweight. Having too much fat, sugar, calories, or salt (sodium) in your diet. Drinking too much alcohol. Other risk factors include: Having a personal history of heart disease, diabetes, high cholesterol, or kidney disease. Stress. Having a family history of high blood pressure and high cholesterol. Having obstructive sleep apnea. Age. The risk increases with age. What are the signs or symptoms? High blood pressure may not cause symptoms. Very high blood pressure (hypertensive crisis) may cause: Headache. Fast or irregular heartbeats (palpitations). Shortness of breath. Nosebleed. Nausea and vomiting. Vision changes. Severe chest pain, dizziness, and seizures. How is this diagnosed? This condition is diagnosed by  measuring your blood pressure while you are seated, with your arm resting on a flat surface, your legs uncrossed, and your feet flat on the floor. The cuff of the blood pressure monitor will be placed directly against the skin of your upper arm at the level of your heart. Blood pressure should be measured at least twice using the same arm. Certain conditions can cause a difference in blood pressure between your right and left arms. If you have a high blood pressure reading during one visit or you have normal blood pressure with other risk factors, you may be asked to: Return on a different day to have your blood pressure checked again. Monitor your blood pressure at home for 1 week or longer. If you are diagnosed with hypertension, you may have other blood or imaging tests to help your health care provider understand your overall risk for other conditions. How is this treated? This condition is treated by making healthy lifestyle changes, such as eating healthy foods, exercising more, and reducing your alcohol intake. You may be referred for counseling on a healthy diet and physical activity. Your health care provider may prescribe medicine if lifestyle changes are not enough to get your blood pressure under control and if: Your systolic blood pressure is above 130. Your diastolic blood pressure is above 80. Your personal target blood pressure may vary depending on your medical conditions, your age, and other factors. Follow these instructions at home: Eating and drinking  Eat a diet that is high in fiber and potassium, and low in sodium, added sugar, and fat. An example of this eating plan is called the DASH diet. DASH stands for Dietary Approaches to Stop Hypertension. To eat this way: Eat  plenty of fresh fruits and vegetables. Try to fill one half of your plate at each meal with fruits and vegetables. Eat whole grains, such as whole-wheat pasta, brown rice, or whole-grain bread. Fill about one  fourth of your plate with whole grains. Eat or drink low-fat dairy products, such as skim milk or low-fat yogurt. Avoid fatty cuts of meat, processed or cured meats, and poultry with skin. Fill about one fourth of your plate with lean proteins, such as fish, chicken without skin, beans, eggs, or tofu. Avoid pre-made and processed foods. These tend to be higher in sodium, added sugar, and fat. Reduce your daily sodium intake. Many people with hypertension should eat less than 1,500 mg of sodium a day. Do not drink alcohol if: Your health care provider tells you not to drink. You are pregnant, may be pregnant, or are planning to become pregnant. If you drink alcohol: Limit how much you have to: 0-1 drink a day for women. 0-2 drinks a day for men. Know how much alcohol is in your drink. In the [place] one drink equals one 12 oz bottle of beer (355 mL), one 5 oz glass of wine (148 mL), or one 1 oz glass of hard liquor (44 mL). Lifestyle  Work with your health care provider to maintain a healthy body weight or to lose weight. Ask what an ideal weight is for you. Get at least 30 minutes of exercise that causes your heart to beat faster (aerobic exercise) most days of the week. Activities may include walking, swimming, or biking. Include exercise to strengthen your muscles (resistance exercise), such as Pilates or lifting weights, as part of your weekly exercise routine. Try to do these types of exercises for 30 minutes at least 3 days a week. Do not use any products that contain nicotine or tobacco. These products include cigarettes, chewing tobacco, and vaping devices, such as e-cigarettes. If you need help quitting, ask your health care provider. Monitor your blood pressure at home as told by your health care provider. Keep all follow-up visits. This is important. Medicines Take over-the-counter and prescription medicines only as told by your health care provider. Follow directions carefully. Blood  pressure medicines must be taken as prescribed. Do not skip doses of blood pressure medicine. Doing this puts you at risk for problems and can make the medicine less effective. Ask your health care provider about side effects or reactions to medicines that you should watch for. Contact a health care provider if you: Think you are having a reaction to a medicine you are taking. Have headaches that keep coming back (recurring). Feel dizzy. Have swelling in your ankles. Have trouble with your vision. Get help right away if you: Develop a severe headache or confusion. Have unusual weakness or numbness. Feel faint. Have severe pain in your chest or abdomen. Vomit repeatedly. Have trouble breathing. These symptoms may be an emergency. Get help right away. Call 911. Do not wait to see if the symptoms will go away. Do not drive yourself to the hospital. Summary Hypertension is when the force of blood pumping through your arteries is too strong. If this condition is not controlled, it may put you at risk for serious complications. Your personal target blood pressure may vary depending on your medical conditions, your age, and other factors. For most people, a normal blood pressure is less than 120/80. Hypertension is treated with lifestyle changes, medicines, or a combination of both. Lifestyle changes include losing weight, eating a healthy,  low-sodium diet, exercising more, and limiting alcohol. This information is not intended to replace advice given to you by your health care provider. Make sure you discuss any questions you have with your health care provider. Document Revised: 09/06/2021 Document Reviewed: 09/06/2021 Elsevier Patient Education  2024 [employer].

## 2024-01-09 DIAGNOSIS — R8281 Pyuria: Secondary | ICD-10-CM | POA: Insufficient documentation

## 2024-01-09 DIAGNOSIS — I160 Hypertensive urgency: Secondary | ICD-10-CM | POA: Insufficient documentation

## 2024-01-09 LAB — URINALYSIS, ROUTINE W REFLEX MICROSCOPIC
Bilirubin Urine: NEGATIVE
Leukocytes,Ua: NEGATIVE
Nitrite: NEGATIVE
Specific Gravity, Urine: 1.025 (ref 1.000–1.030)
Total Protein, Urine: NEGATIVE
Urine Glucose: NEGATIVE
Urobilinogen, UA: 1 (ref 0.0–1.0)
pH: 6.5 (ref 5.0–8.0)

## 2024-01-09 LAB — HEMOGLOBIN A1C: Hgb A1c MFr Bld: 5.7 % (ref 4.6–6.5)

## 2024-01-10 ENCOUNTER — Other Ambulatory Visit: Payer: PPO

## 2024-01-10 ENCOUNTER — Telehealth: Payer: Self-pay | Admitting: Internal Medicine

## 2024-01-10 DIAGNOSIS — R8281 Pyuria: Secondary | ICD-10-CM | POA: Diagnosis not present

## 2024-01-10 NOTE — Telephone Encounter (Signed)
 Patient has imaging ordered by Dr. Jonny Ruiz and they spoke with the office that would be doing it. They were told they could not be scheduled until the ned of March. They would like to know if there is any way they can have the imaging done sooner. Best callback is [phone].

## 2024-01-10 NOTE — Telephone Encounter (Signed)
 Sorry the Echo was ordered per Dr Yetta Barre, and even so, I would not be able to get this done any sooner, thanks

## 2024-01-12 DIAGNOSIS — Z0001 Encounter for general adult medical examination with abnormal findings: Secondary | ICD-10-CM | POA: Insufficient documentation

## 2024-01-12 DIAGNOSIS — N3000 Acute cystitis without hematuria: Secondary | ICD-10-CM | POA: Insufficient documentation

## 2024-01-12 LAB — CULTURE, URINE COMPREHENSIVE: RESULT:: NO GROWTH

## 2024-01-12 MED ORDER — SULFAMETHOXAZOLE-TRIMETHOPRIM 800-160 MG PO TABS: 1.0000 | ORAL_TABLET | Freq: Two times a day (BID) | ORAL | 0 refills | Status: AC

## 2024-01-18 ENCOUNTER — Telehealth: Payer: Self-pay

## 2024-01-18 NOTE — Telephone Encounter (Signed)
 Copied from CRM [id number]. Topic: General - Other >> Jan 18, 2024 12:13 PM Truddie Crumble wrote: Reason for CRM: patient daughter called stating the patient blood pressure dropped when they was out shopping and she went down on the floor with no injuries. Patient daughter called 911 and the paramedic stated she was likely dehydrated to the reason why her blood pressure dropped and her body is trying to adjust to the new blood pressure medication. Patient saw Dr. Yetta Barre last week.  CB [phone]

## 2024-01-18 NOTE — Telephone Encounter (Signed)
 Please ask pt to stop the triamterene - HCT 37-25 mg as this is likely too much for her.

## 2024-01-18 NOTE — Telephone Encounter (Signed)
 Called and left voice mail

## 2024-01-31 ENCOUNTER — Other Ambulatory Visit: Payer: Self-pay | Admitting: Internal Medicine

## 2024-01-31 DIAGNOSIS — I160 Hypertensive urgency: Secondary | ICD-10-CM

## 2024-02-11 ENCOUNTER — Encounter: Payer: Self-pay | Admitting: Internal Medicine

## 2024-02-11 ENCOUNTER — Ambulatory Visit (INDEPENDENT_AMBULATORY_CARE_PROVIDER_SITE_OTHER): Payer: PPO

## 2024-02-11 DIAGNOSIS — R7989 Other specified abnormal findings of blood chemistry: Secondary | ICD-10-CM | POA: Diagnosis not present

## 2024-02-11 DIAGNOSIS — R0609 Other forms of dyspnea: Secondary | ICD-10-CM | POA: Diagnosis not present

## 2024-02-11 LAB — ECHOCARDIOGRAM COMPLETE
Area-P 1/2: 3.91 cm2
S' Lateral: 2.53 cm

## 2024-02-12 ENCOUNTER — Telehealth: Payer: Self-pay | Admitting: Internal Medicine

## 2024-02-12 NOTE — Telephone Encounter (Signed)
 Copied from CRM [id number]. Topic: General - Billing Inquiry >> Feb 12, 2024 11:49 AM Cammy Copa D wrote: Reason for CRM: PT had labs done on Feb. 27th urinalysis, however, claim was sent to the wrong insurance which led to it being denied. Claim was supposed to be sent to [facility] but was mistakenly sent to [employer].  ---  Can we re-file this claim w/ pt's [employer] ins?

## 2024-03-03 ENCOUNTER — Ambulatory Visit (INDEPENDENT_AMBULATORY_CARE_PROVIDER_SITE_OTHER): Payer: PPO | Admitting: Internal Medicine

## 2024-03-03 ENCOUNTER — Encounter: Payer: Self-pay | Admitting: Internal Medicine

## 2024-03-03 VITALS — BP 122/74 | HR 65 | Temp 98.0°F | Ht 62.0 in | Wt 135.0 lb

## 2024-03-03 DIAGNOSIS — H6120 Impacted cerumen, unspecified ear: Secondary | ICD-10-CM | POA: Insufficient documentation

## 2024-03-03 DIAGNOSIS — Z0001 Encounter for general adult medical examination with abnormal findings: Secondary | ICD-10-CM

## 2024-03-03 DIAGNOSIS — I10 Essential (primary) hypertension: Secondary | ICD-10-CM

## 2024-03-03 DIAGNOSIS — E782 Mixed hyperlipidemia: Secondary | ICD-10-CM

## 2024-03-03 DIAGNOSIS — H6123 Impacted cerumen, bilateral: Secondary | ICD-10-CM | POA: Diagnosis not present

## 2024-03-03 DIAGNOSIS — R739 Hyperglycemia, unspecified: Secondary | ICD-10-CM

## 2024-03-03 DIAGNOSIS — Z0000 Encounter for general adult medical examination without abnormal findings: Secondary | ICD-10-CM

## 2024-03-03 DIAGNOSIS — E559 Vitamin D deficiency, unspecified: Secondary | ICD-10-CM | POA: Diagnosis not present

## 2024-03-03 NOTE — Assessment & Plan Note (Signed)
 Last vitamin D  Lab Results  Component Value Date   VD25OH 34.89 02/27/2023   Low, to start oral replacement

## 2024-03-03 NOTE — Assessment & Plan Note (Signed)
 Resolved with treatment,  to f/u any worsening symptoms or concerns

## 2024-03-03 NOTE — Assessment & Plan Note (Signed)

## 2024-03-03 NOTE — Assessment & Plan Note (Signed)
 Improved with new amlodipine , BP Readings from Last 3 Encounters:  03/03/24 122/74  01/08/24 (!) 194/76  02/27/23 118/70   Stable, pt to continue medical treatment and dyazide 1 qd

## 2024-03-03 NOTE — Patient Instructions (Signed)
 Please have your Shingrix  (shingles) shots done at your local pharmacy, and the Tdap tetanus shot  Please continue all other medications as before, and refills have been done if requested.  Please have the pharmacy call with any other refills you may need.  Please continue your efforts at being more active, low cholesterol diet, and weight control.  You are otherwise up to date with prevention measures today.  Please keep your appointments with your specialists as you may have planned  No further lab work needed today  Please make an Appointment to return for your 1 year visit, or sooner if needed

## 2024-03-03 NOTE — Progress Notes (Signed)
 Patient ID: Brittany Burgess, female   DOB: Mar 15, 1933, 89 y.o.   MRN: [id number]         Chief Complaint:: wellness exam and cerumen impaction, htn, hld, hyperglycemia       HPI:  Brittany Burgess is a 89 y.o. female here for wellness exam; for tap and shingrix  at pharmacy, o/w up to date                        Also Pt denies chest pain, increased sob or doe, wheezing, orthopnea, PND, increased LE swelling, palpitations, dizziness or syncope.   Pt denies polydipsia, polyuria, or new focal neuro s/s.    Pt denies fever, wt loss, night sweats, loss of appetite, or other constitutional symptoms  Does have reduced hearing with bilateral cerumen impactions   Wt Readings from Last 3 Encounters:  03/03/24 135 lb (61.2 kg)  01/08/24 138 lb 3.2 oz (62.7 kg)  02/27/23 132 lb (59.9 kg)   BP Readings from Last 3 Encounters:  03/03/24 122/74  01/08/24 (!) 194/76  02/27/23 118/70   Immunization History  Administered Date(s) Administered   Fluad Quad(high Dose 65+) 07/19/2019, 06/26/2022   Fluad Trivalent(High Dose 65+) 01/08/2024   Influenza, High Dose Seasonal PF 08/05/2018, 06/26/2022   Influenza-Unspecified 08/02/2017   PFIZER Comirnaty(Gray Top)Covid-19 Tri-Sucrose Vaccine 03/15/2020, 04/08/2020   Pneumococcal Conjugate-13 12/08/2015   Pneumococcal Polysaccharide-23 02/18/2021   Tdap 11/14/2011   Zoster Recombinant(Shingrix ) 06/07/2022   Zoster, Live 11/13/2005   Health Maintenance Due  Topic Date Due   Medicare Annual Wellness (AWV)  Never done   DTaP/Tdap/Td (2 - Td or Tdap) 11/13/2021   Zoster Vaccines- Shingrix  (2 of 2) 08/02/2022      Past Medical History:  Diagnosis Date   Allergic conjunctivitis of both eyes    Allergic rhinitis    Colon polyps    Glaucoma    Hypertension    Osteopenia    Vitamin D  deficiency    Past Surgical History:  Procedure Laterality Date   CATARACT EXTRACTION  2007   FOOT SURGERY      reports that she has never smoked. She has never used  smokeless tobacco. She reports that she does not drink alcohol and does not use drugs. family history includes Cancer in her father and mother; Diabetes in her father and mother. Allergies  Allergen Reactions   Combigan [Brimonidine Tartrate-Timolol]     Red eyelids    Current Outpatient Medications on File Prior to Visit  Medication Sig Dispense Refill   amLODipine  (NORVASC ) 5 MG tablet TAKE 1 TABLET (5 MG TOTAL) BY MOUTH DAILY. 90 tablet 1   aspirin  EC 81 MG tablet Take 1 tablet (81 mg total) by mouth daily. 90 tablet 11   dorzolamide-timolol (COSOPT) 2-0.5 % ophthalmic solution Place 1 drop into both eyes 2 (two) times daily.     lovastatin  (MEVACOR ) 40 MG tablet TAKE 1 TABLET BY MOUTH EVERYDAY AT BEDTIME 90 tablet 3   timolol (TIMOPTIC) 0.5 % ophthalmic solution 1 drop every morning.     triamterene -hydrochlorothiazide (DYAZIDE) 37.5-25 MG capsule TAKE 1 EACH (1 CAPSULE TOTAL) BY MOUTH DAILY. 90 capsule 1   No current facility-administered medications on file prior to visit.        ROS:  All others reviewed and negative.  Objective        PE:  BP 122/74 (BP Location: Left Arm, Patient Position: Sitting, Cuff Size: Normal)   Pulse 65  Temp 98 F (36.7 C) (Oral)   Ht 5\' 2"  (1.575 m)   Wt 135 lb (61.2 kg)   SpO2 99%   BMI 24.69 kg/m                 Constitutional: Pt appears in NAD               HENT: Head: NCAT.                Right Ear: External ear normal.                 Left Ear: External ear normal.                Eyes: . Pupils are equal, round, and reactive to light. Conjunctivae and EOM are normal               Nose: without d/c or deformity               Neck: Neck supple. Gross normal ROM               Cardiovascular: Normal rate and regular rhythm.                 Pulmonary/Chest: Effort normal and breath sounds without rales or wheezing.                Abd:  Soft, NT, ND, + BS, no organomegaly               Neurological: Pt is alert. At baseline orientation,  motor grossly intact               Skin: Skin is warm. No rashes, no other new lesions, LE edema - trace pedal bilateral               Psychiatric: Pt behavior is normal without agitation   Micro: none  Cardiac tracings I have personally interpreted today:  none  Pertinent Radiological findings (summarize): none   Lab Results  Component Value Date   WBC 6.0 01/08/2024   HGB 13.4 01/08/2024   HCT 40.8 01/08/2024   PLT 252.0 01/08/2024   GLUCOSE 147 (H) 01/08/2024   CHOL 144 02/27/2023   TRIG 155.0 (H) 02/27/2023   HDL 55.20 02/27/2023   LDLCALC 58 02/27/2023   ALT 9 01/08/2024   AST 16 01/08/2024   NA 142 01/08/2024   K 3.7 01/08/2024   CL 107 01/08/2024   CREATININE 0.81 01/08/2024   BUN 17 01/08/2024   CO2 28 01/08/2024   TSH 3.12 01/08/2024   HGBA1C 5.7 01/09/2024   Assessment/Plan:  Brittany Burgess is a 89 y.o. White or Caucasian [1] female with  has a past medical history of Allergic conjunctivitis of both eyes, Allergic rhinitis, Colon polyps, Glaucoma, Hypertension, Osteopenia, and Vitamin D  deficiency.  Encounter for well adult exam with abnormal findings Age and sex appropriate education and counseling updated with regular exercise and diet Referrals for preventative services - none needed Immunizations addressed - for tdap and shingrix  at pharmacy Smoking counseling  - none needed Evidence for depression or other mood disorder - none significant Most recent labs reviewed. I have personally reviewed and have noted: 1) the patient's medical and social history 2) The patient's current medications and supplements 3) The patient's height, weight, and BMI have been recorded in the chart   Hypertension Improved with new amlodipine , BP Readings from Last 3 Encounters:  03/03/24 122/74  01/08/24 (!) 194/76  02/27/23 118/70   Stable, pt to continue medical treatment and dyazide 1 qd   Vitamin D  deficiency Last vitamin D  Lab Results  Component Value Date    VD25OH 34.89 02/27/2023   Low, to start oral replacement   HLD (hyperlipidemia) Lab Results  Component Value Date   LDLCALC 58 02/27/2023   Stable, pt to continue current statin lovastatin  40 mg qd   Hyperglycemia Lab Results  Component Value Date   HGBA1C 5.7 01/09/2024   Stable, pt to continue current medical treatment  - diet, wt control   Cerumen impaction Resolved with treatment,  to f/u any worsening symptoms or concerns  Followup: Return in about 1 year (around 03/03/2025).  Rosalia Colonel, MD 03/03/2024 11:58 AM [facility]

## 2024-03-03 NOTE — Assessment & Plan Note (Signed)
 Lab Results  Component Value Date   HGBA1C 5.7 01/09/2024   Stable, pt to continue current medical treatment  - diet,wt control

## 2024-03-03 NOTE — Assessment & Plan Note (Signed)
 Lab Results  Component Value Date   LDLCALC 58 02/27/2023   Stable, pt to continue current statin lovastatin  40 mg qd

## 2024-03-18 ENCOUNTER — Other Ambulatory Visit: Payer: Self-pay

## 2024-03-18 ENCOUNTER — Other Ambulatory Visit: Payer: Self-pay | Admitting: Internal Medicine

## 2024-03-18 MED ORDER — LOVASTATIN 40 MG PO TABS: 40.0000 mg | ORAL_TABLET | Freq: Every day | ORAL | 3 refills | Status: AC

## 2024-03-18 NOTE — Telephone Encounter (Unsigned)
 Copied from CRM [id number]. Topic: Clinical - Medication Refill >> Mar 18, 2024 10:31 AM Ovid Blow wrote: Most Recent Primary Care Visit:  Provider: Roslyn Coombe  Department: [facility]  Visit Type: PHYSICAL  Date: 03/03/2024  Medication: lovastatin  (MEVACOR ) 40 MG tablet **100 day supply** Has the patient contacted their pharmacy? Yes (Agent: If no, request that the patient contact the pharmacy for the refill. If patient does not wish to contact the pharmacy document the reason why and proceed with request.) (Agent: If yes, when and what did the pharmacy advise?)  Is this the correct pharmacy for this prescription? Yes If no, delete pharmacy and type the correct one.  This is the patient's preferred pharmacy:  [id number] - [place], [place] - [address] [place] [address] Phone: [phone] Fax: [phone]   Has the prescription been filled recently? No  Is the patient out of the medication? Yes  Has the patient been seen for an appointment in the last year OR does the patient have an upcoming appointment? Yes  Can we respond through MyChart? Yes  Agent: Please be advised that Rx refills may take up to 3 business days. We ask that you follow-up with your pharmacy.

## 2024-03-18 NOTE — Telephone Encounter (Signed)
 Copied from CRM [id number]. Topic: Clinical - Medication Refill >> Mar 18, 2024 10:31 AM Ovid Blow wrote: Most Recent Primary Care Visit:  Provider: Roslyn Coombe  Department: [facility]  Visit Type: PHYSICAL  Date: 03/03/2024  Medication: lovastatin  (MEVACOR ) 40 MG tablet **100 day supply** Has the patient contacted their pharmacy? Yes (Agent: If no, request that the patient contact the pharmacy for the refill. If patient does not wish to contact the pharmacy document the reason why and proceed with request.) (Agent: If yes, when and what did the pharmacy advise?)  Is this the correct pharmacy for this prescription? Yes If no, delete pharmacy and type the correct one.  This is the patient's preferred pharmacy:  [id number] - [place], [place] - [address] [place] [address] Phone: [phone] Fax: [phone]   Has the prescription been filled recently? No  Is the patient out of the medication? Yes  Has the patient been seen for an appointment in the last year OR does the patient have an upcoming appointment? Yes  Can we respond through MyChart? Yes  Agent: Please be advised that Rx refills may take up to 3 business days. We ask that you follow-up with your pharmacy.

## 2024-03-19 DIAGNOSIS — H401121 Primary open-angle glaucoma, left eye, mild stage: Secondary | ICD-10-CM | POA: Diagnosis not present

## 2024-03-19 DIAGNOSIS — H0288B Meibomian gland dysfunction left eye, upper and lower eyelids: Secondary | ICD-10-CM | POA: Diagnosis not present

## 2024-03-19 DIAGNOSIS — H401112 Primary open-angle glaucoma, right eye, moderate stage: Secondary | ICD-10-CM | POA: Diagnosis not present

## 2024-07-03 ENCOUNTER — Other Ambulatory Visit: Payer: Self-pay | Admitting: Internal Medicine

## 2024-07-03 DIAGNOSIS — I160 Hypertensive urgency: Secondary | ICD-10-CM

## 2024-07-24 DIAGNOSIS — D23112 Other benign neoplasm of skin of right lower eyelid, including canthus: Secondary | ICD-10-CM | POA: Diagnosis not present

## 2024-07-24 DIAGNOSIS — H401112 Primary open-angle glaucoma, right eye, moderate stage: Secondary | ICD-10-CM | POA: Diagnosis not present

## 2024-07-24 DIAGNOSIS — H401121 Primary open-angle glaucoma, left eye, mild stage: Secondary | ICD-10-CM | POA: Diagnosis not present

## 2024-07-24 DIAGNOSIS — H02423 Myogenic ptosis of bilateral eyelids: Secondary | ICD-10-CM | POA: Diagnosis not present

## 2024-07-24 DIAGNOSIS — Z961 Presence of intraocular lens: Secondary | ICD-10-CM | POA: Diagnosis not present

## 2024-08-11 ENCOUNTER — Ambulatory Visit: Payer: Self-pay

## 2024-08-11 NOTE — Telephone Encounter (Signed)
 FYI Only or Action Required?: Action required by provider: request for appointment.  Patient was last seen in primary care on 03/03/2024 by Norleen Lynwood ORN, MD.  Called Nurse Triage reporting Back Pain.  Symptoms began several weeks ago.  Interventions attempted: Nothing.  Symptoms are: unchanged.  Triage Disposition: See PCP When Office is Open (Within 3 Days)  Patient/caregiver understands and will follow disposition?: Yes    Copied from CRM #[id number]. Topic: Clinical - Red Word Triage >> Aug 11, 2024 10:07 AM [place] A wrote: [facility] that prompted transfer to Nurse Triage: Patient has been having pain in her lower back area for about two weeks. Patient's daughter said maybe from her kidneys. Reason for Disposition  [1] Age > 50 AND [2] no history of prior similar back pain  Answer Assessment - Initial Assessment Questions 1. ONSET: When did the pain begin? (e.g., minutes, hours, days)     2 weeks 2. LOCATION: Where does it hurt? (upper, mid or lower back)     low 3. SEVERITY: How bad is the pain?  (e.g., Scale 1-10; mild, moderate, or severe)     moderate 4. PATTERN: Is the pain constant? (e.g., yes, no; constant, intermittent)      unsure 5. RADIATION: Does the pain shoot into your legs or somewhere else?     no 6. CAUSE:  What do you think is causing the back pain?      unsure 7. BACK OVERUSE:  Any recent lifting of heavy objects, strenuous work or exercise?     no 8. MEDICINES: What have you taken so far for the pain? (e.g., nothing, acetaminophen, NSAIDS)     no 9. NEUROLOGIC SYMPTOMS: Do you have any weakness, numbness, or problems with bowel/bladder control?     no 10. OTHER SYMPTOMS: Do you have any other symptoms? (e.g., fever, abdomen pain, burning with urination, blood in urine)       no 11. PREGNANCY: Is there any chance you are pregnant? When was your last menstrual period?       no  Protocols used: Back Pain-A-AH

## 2024-08-12 ENCOUNTER — Encounter: Payer: Self-pay | Admitting: Family Medicine

## 2024-08-12 ENCOUNTER — Ambulatory Visit: Payer: Self-pay | Admitting: Family Medicine

## 2024-08-12 ENCOUNTER — Ambulatory Visit: Admitting: Family Medicine

## 2024-08-12 VITALS — BP 122/80 | HR 59 | Temp 97.7°F | Ht 62.0 in | Wt 133.4 lb

## 2024-08-12 DIAGNOSIS — I160 Hypertensive urgency: Secondary | ICD-10-CM

## 2024-08-12 DIAGNOSIS — Z23 Encounter for immunization: Secondary | ICD-10-CM | POA: Diagnosis not present

## 2024-08-12 DIAGNOSIS — M545 Low back pain, unspecified: Secondary | ICD-10-CM

## 2024-08-12 LAB — CBC WITH DIFFERENTIAL/PLATELET
Basophils Absolute: 0 K/uL (ref 0.0–0.1)
Basophils Relative: 0.5 % (ref 0.0–3.0)
Eosinophils Absolute: 0.1 K/uL (ref 0.0–0.7)
Eosinophils Relative: 1.5 % (ref 0.0–5.0)
HCT: 42 % (ref 36.0–46.0)
Hemoglobin: 14 g/dL (ref 12.0–15.0)
Lymphocytes Relative: 35.6 % (ref 12.0–46.0)
Lymphs Abs: 2.5 K/uL (ref 0.7–4.0)
MCHC: 33.3 g/dL (ref 30.0–36.0)
MCV: 89.7 fl (ref 78.0–100.0)
Monocytes Absolute: 0.8 K/uL (ref 0.1–1.0)
Monocytes Relative: 11.1 % (ref 3.0–12.0)
Neutro Abs: 3.6 K/uL (ref 1.4–7.7)
Neutrophils Relative %: 51.3 % (ref 43.0–77.0)
Platelets: 283 K/uL (ref 150.0–400.0)
RBC: 4.68 Mil/uL (ref 3.87–5.11)
RDW: 13.3 % (ref 11.5–15.5)
WBC: 7.1 K/uL (ref 4.0–10.5)

## 2024-08-12 LAB — POC URINALSYSI DIPSTICK (AUTOMATED)
Bilirubin, UA: NEGATIVE
Glucose, UA: NEGATIVE
Ketones, UA: NEGATIVE
Leukocytes, UA: NEGATIVE
Nitrite, UA: NEGATIVE
Protein, UA: NEGATIVE — AB
Spec Grav, UA: 1.01 (ref 1.010–1.025)
Urobilinogen, UA: NEGATIVE U/dL — AB
pH, UA: 7 (ref 5.0–8.0)

## 2024-08-12 LAB — COMPREHENSIVE METABOLIC PANEL WITH GFR
ALT: 10 U/L (ref 0–35)
AST: 17 U/L (ref 0–37)
Albumin: 4.5 g/dL (ref 3.5–5.2)
Alkaline Phosphatase: 61 U/L (ref 39–117)
BUN: 17 mg/dL (ref 6–23)
CO2: 29 meq/L (ref 19–32)
Calcium: 9.8 mg/dL (ref 8.4–10.5)
Chloride: 104 meq/L (ref 96–112)
Creatinine, Ser: 0.69 mg/dL (ref 0.40–1.20)
GFR: 76.98 mL/min (ref >60.00)
Glucose, Bld: 94 mg/dL (ref 70–99)
Potassium: 4.3 meq/L (ref 3.5–5.1)
Sodium: 139 meq/L (ref 135–145)
Total Bilirubin: 0.4 mg/dL (ref 0.2–1.2)
Total Protein: 7.6 g/dL (ref 6.0–8.3)

## 2024-08-12 MED ORDER — AMLODIPINE BESYLATE 5 MG PO TABS: 5.0000 mg | ORAL_TABLET | Freq: Every day | ORAL | 1 refills | Status: AC

## 2024-08-12 NOTE — Progress Notes (Signed)
 Acute Office Visit  Subjective:     Patient ID: Brittany Burgess, female    DOB: 1933-07-27, 89 y.o.   MRN: [id number]  Chief Complaint  Patient presents with   Back Pain    Lower back pain for several weeks. Follow up for medication, memory loss.     HPI  Discussed the use of AI scribe software for clinical note transcription with the patient, who gave verbal consent to proceed.  History of Present Illness Brittany Burgess is an 89 year old female who presents for a routine follow-up visit. She is accompanied by her daughter today.   Hypertension management - Consistently takes antihypertensive medication - Last medication adjustment in late February or early March - No headaches - No lower extremity edema - Maintains daily physical activity by walking  Musculoskeletal symptoms - Experienced back pain a couple of weeks ago, now resolved - No current back pain - No symptoms suggestive of renal involvement  Preventive health maintenance - Has not received influenza vaccination this year - Prefers to receive annual influenza vaccination     ROS Per HPI      Objective:    BP 122/80   Pulse (!) 59   Temp 97.7 F (36.5 C)   Ht 5' 2 (1.575 m)   Wt 133 lb 6.4 oz (60.5 kg)   SpO2 99%   BMI 24.40 kg/m    Physical Exam Vitals and nursing note reviewed.  Constitutional:      General: She is not in acute distress.    Appearance: She is normal weight.     Comments: Elderly  HENT:     Head: Normocephalic and atraumatic.     Right Ear: External ear normal.     Left Ear: External ear normal.     Nose: Nose normal.  Eyes:     Extraocular Movements: Extraocular movements intact.     Pupils: Pupils are equal, round, and reactive to light.  Cardiovascular:     Rate and Rhythm: Normal rate and regular rhythm.     Pulses: Normal pulses.     Heart sounds: Normal heart sounds.  Pulmonary:     Effort: Pulmonary effort is normal. No respiratory distress.      Breath sounds: Normal breath sounds. No wheezing, rhonchi or rales.  Musculoskeletal:        General: Normal range of motion.     Cervical back: Normal range of motion.     Right lower leg: No edema.     Left lower leg: No edema.  Lymphadenopathy:     Cervical: No cervical adenopathy.  Neurological:     General: No focal deficit present.     Mental Status: She is alert and oriented to person, place, and time.  Psychiatric:        Mood and Affect: Mood normal.        Thought Content: Thought content normal.     No results found for any visits on 08/12/24.      Assessment & Plan:   Assessment and Plan Assessment & Plan Hypertension, malignant Hypertension well-managed with medication. Blood pressure controlled without symptoms. Kidney function monitoring considered due to medication. - Continue current antihypertensive regimen. - Order blood work to assess kidney function. - Schedule follow-up appointment in six months.  Low back pain, bilateral without sciatica -Resolved - UA looks good, no culture needed  Immunization due Flu vaccination due as part of annual health maintenance. - Administer flu shot during  the visit.     Orders Placed This Encounter  Procedures   CBC with Differential/Platelet    Release to patient:   Immediate [1]   Comprehensive metabolic panel with GFR    Release to patient:   Immediate [1]     Meds ordered this encounter  Medications   amLODipine  (NORVASC ) 5 MG tablet    Sig: Take 1 tablet (5 mg total) by mouth daily.    Dispense:  90 tablet    Refill:  1    Return in about 6 months (around 02/09/2025) for med f/u.  Corean LITTIE Ku, FNP

## 2024-08-12 NOTE — Patient Instructions (Signed)
 I have sent in refills of your amlodipine .  Continue current medication regimen.   We have given your flu vaccine today.   We are checking labs today, will be in contact with any results that require further attention  Follow up with Dr. Norleen in about 6 mos for follow up, sooner if needed.

## 2024-08-12 NOTE — Addendum Note (Signed)
 Addended byBETHA LUCETTA CLEATRICE LELON on: 08/12/2024 02:07 PM   Modules accepted: Orders

## 2024-11-17 ENCOUNTER — Ambulatory Visit (INDEPENDENT_AMBULATORY_CARE_PROVIDER_SITE_OTHER): Admitting: Nurse Practitioner

## 2024-11-17 ENCOUNTER — Encounter: Payer: Self-pay | Admitting: Nurse Practitioner

## 2024-11-17 VITALS — BP 132/78 | HR 63 | Temp 97.1°F | Ht 63.0 in | Wt 133.6 lb

## 2024-11-17 DIAGNOSIS — M8580 Other specified disorders of bone density and structure, unspecified site: Secondary | ICD-10-CM | POA: Diagnosis not present

## 2024-11-17 DIAGNOSIS — E782 Mixed hyperlipidemia: Secondary | ICD-10-CM | POA: Diagnosis not present

## 2024-11-17 DIAGNOSIS — H409 Unspecified glaucoma: Secondary | ICD-10-CM | POA: Diagnosis not present

## 2024-11-17 DIAGNOSIS — I10 Essential (primary) hypertension: Secondary | ICD-10-CM | POA: Diagnosis not present

## 2024-11-17 NOTE — Patient Instructions (Signed)
 It was great to see you!  You are due for the tetanus vaccine - you can get this at any pharmacy   Let's follow-up in 3 months, sooner if you have concerns.  If a referral was placed today, you will be contacted for an appointment. Please note that routine referrals can sometimes take up to 3-4 weeks to process. Please call our office if you haven't heard anything after this time frame.  Take care,  Tinnie Harada, NP

## 2024-11-17 NOTE — Assessment & Plan Note (Signed)
 Chronic, stable. Managed with lovastatin  and aspirin . Continue lovastatin  40mg  daily. Check labs next visit.

## 2024-11-17 NOTE — Assessment & Plan Note (Signed)
 Dexa in 2022 showed T score -2.2. She has not taken any prescription medications for this. Consider repeating bone density next visit.

## 2024-11-17 NOTE — Assessment & Plan Note (Signed)
 Managed with timolol combination eye drops and regular ophthalmology follow-ups. Continue timolol combination eye drops and regular ophthalmology follow-ups twice a year.

## 2024-11-17 NOTE — Progress Notes (Signed)
 "  New Patient Visit  BP 132/78 (BP Location: Right Arm, Patient Position: Sitting, Cuff Size: Normal)   Pulse 63   Temp (!) 97.1 F (36.2 C)   Ht 5' 3 (1.6 m)   Wt 133 lb 9.6 oz (60.6 kg)   SpO2 100%   BMI 23.67 kg/m    Subjective:    Patient ID: Brittany Burgess, female    DOB: 1933-03-16, 89 y.o.   MRN: [id number]  CC: Chief Complaint  Patient presents with   Establish Care    NP. Est. Care, no concerns    HPI: Brittany Burgess is a 89 y.o. female presents for new patient visit to establish care.  Introduced to publishing rights manager role and practice setting.  All questions answered.  Discussed provider/patient relationship and expectations.  Discussed the use of AI scribe software for clinical note transcription with the patient, who gave verbal consent to proceed.  History of Present Illness   Brittany Burgess is an 89 year old female who presents to establish care. She is accompanied by her daughter, Marval.  She has hypertension and is currently on amlodipine  5 mg daily and baby aspirin  daily. She previously took triamterene  and hydrochlorothiazide but stopped after a syncopal episode.   She has a history of hyperlipidemia and is taking lovastatin  40mg  daily.   She has glaucoma treated with timolol combination eye drops and follows with ophthalmology twice a year.  She has fluctuating low vitamin D  levels.  She has a history of osteopenia and dexa scan in 2022 showed T score -2.2. She has not been prescribed anything in the past other than vitamin D .   She denies chest pain, shortness of breath, recent falls, headaches, dizziness, rashes, or skin issues. She notes a stuffy nose she attributes to the season and very rare need for bladder control pads.     Depression and anxiety screen done:     11/17/2024    1:13 PM 03/03/2024   11:03 AM 02/27/2023   10:35 AM 02/21/2022   11:03 AM 02/21/2022   10:46 AM  Depression screen PHQ 2/9  Decreased Interest 0 0 0 0 0  Down,  Depressed, Hopeless 0 0 0 0 0  PHQ - 2 Score 0 0 0 0 0  Altered sleeping 0      Tired, decreased energy 0      Change in appetite 0      Feeling bad or failure about yourself  0      Trouble concentrating 0      Moving slowly or fidgety/restless 0      Suicidal thoughts 0      PHQ-9 Score 0      Difficult doing work/chores Not difficult at all          11/17/2024    1:13 PM 02/27/2023   10:35 AM  GAD 7 : Generalized Anxiety Score  Nervous, Anxious, on Edge 0 0  Control/stop worrying 0 0  Worry too much - different things 0 0  Trouble relaxing 0 0  Restless 0 0  Easily annoyed or irritable 0 0  Afraid - awful might happen 0 0  Total GAD 7 Score 0 0  Anxiety Difficulty Not difficult at all Not difficult at all    Past Medical History:  Diagnosis Date   Allergic conjunctivitis of both eyes    Allergic rhinitis    Colon polyps    Glaucoma    Hyperlipidemia    Hypertension  Osteopenia    Vitamin D  deficiency     Past Surgical History:  Procedure Laterality Date   CATARACT EXTRACTION  2007   FOOT SURGERY      Family History  Problem Relation Age of Onset   Diabetes Mother    Cancer Father        lung   Diabetes Daughter      Social History[1]  Medications Ordered Prior to Encounter[2]   Review of Systems  Constitutional: Negative.   HENT: Negative.    Eyes: Negative.   Respiratory: Negative.    Cardiovascular: Negative.   Gastrointestinal: Negative.   Endocrine: Negative.   Genitourinary: Negative.   Musculoskeletal: Negative.   Skin: Negative.   Neurological: Negative.   Psychiatric/Behavioral: Negative.          Objective:    BP 132/78 (BP Location: Right Arm, Patient Position: Sitting, Cuff Size: Normal)   Pulse 63   Temp (!) 97.1 F (36.2 C)   Ht 5' 3 (1.6 m)   Wt 133 lb 9.6 oz (60.6 kg)   SpO2 100%   BMI 23.67 kg/m   Wt Readings from Last 3 Encounters:  11/17/24 133 lb 9.6 oz (60.6 kg)  08/12/24 133 lb 6.4 oz (60.5 kg)  03/03/24  135 lb (61.2 kg)    BP Readings from Last 3 Encounters:  11/17/24 132/78  08/12/24 122/80  03/03/24 122/74    Physical Exam Vitals and nursing note reviewed.  Constitutional:      General: She is not in acute distress.    Appearance: Normal appearance.  HENT:     Head: Normocephalic and atraumatic.  Eyes:     Conjunctiva/sclera: Conjunctivae normal.  Cardiovascular:     Rate and Rhythm: Normal rate and regular rhythm.     Pulses: Normal pulses.     Heart sounds: Normal heart sounds.  Pulmonary:     Effort: Pulmonary effort is normal.     Breath sounds: Normal breath sounds.  Abdominal:     Palpations: Abdomen is soft.     Tenderness: There is no abdominal tenderness.  Musculoskeletal:        General: Normal range of motion.     Cervical back: Normal range of motion and neck supple.     Right lower leg: No edema.     Left lower leg: No edema.  Lymphadenopathy:     Cervical: No cervical adenopathy.  Skin:    General: Skin is warm and dry.  Neurological:     General: No focal deficit present.     Mental Status: She is alert and oriented to person, place, and time.     Cranial Nerves: No cranial nerve deficit.     Coordination: Coordination normal.     Gait: Gait normal.  Psychiatric:        Mood and Affect: Mood normal.        Behavior: Behavior normal.        Thought Content: Thought content normal.        Judgment: Judgment normal.        Assessment & Plan:   Problem List Items Addressed This Visit       Cardiovascular and Mediastinum   Hypertension - Primary   Chronic, stable. Well-controlled with amlodipine  5 mg daily. Continue amlodipine  5 mg daily. Reviewed notes and labs from prior PCP.        Musculoskeletal and Integument   Osteopenia   Dexa in 2022 showed T score -2.2. She has  not taken any prescription medications for this. Consider repeating bone density next visit.         Other   Glaucoma   Chronic, stable. Managed with lovastatin  and  aspirin . Continue lovastatin  40mg  daily. Check labs next visit.       HLD (hyperlipidemia)   Managed with timolol combination eye drops and regular ophthalmology follow-ups. Continue timolol combination eye drops and regular ophthalmology follow-ups twice a year.       Follow up plan: Return in about 3 months (around 02/15/2025) for CPE.  Amarachi Kotz A Darci Lykins     [1]  Social History Tobacco Use   Smoking status: Never   Smokeless tobacco: Never  Vaping Use   Vaping status: Never Used  Substance Use Topics   Alcohol use: No    Alcohol/week: 0.0 standard drinks of alcohol   Drug use: No  [2]  Current Outpatient Medications on File Prior to Visit  Medication Sig Dispense Refill   amLODipine  (NORVASC ) 5 MG tablet Take 1 tablet (5 mg total) by mouth daily. 90 tablet 1   aspirin  EC 81 MG tablet Take 1 tablet (81 mg total) by mouth daily. 90 tablet 11   dorzolamide-timolol (COSOPT) 2-0.5 % ophthalmic solution Place 1 drop into both eyes 2 (two) times daily.     lovastatin  (MEVACOR ) 40 MG tablet Take 1 tablet (40 mg total) by mouth at bedtime. 90 tablet 3   No current facility-administered medications on file prior to visit.   "

## 2024-11-17 NOTE — Assessment & Plan Note (Signed)
 Chronic, stable. Well-controlled with amlodipine  5 mg daily. Continue amlodipine  5 mg daily. Reviewed notes and labs from prior PCP.

## 2025-02-09 ENCOUNTER — Ambulatory Visit: Admitting: Internal Medicine

## 2025-02-17 ENCOUNTER — Ambulatory Visit: Admitting: Nurse Practitioner

## 2025-02-19 ENCOUNTER — Ambulatory Visit: Admitting: Nurse Practitioner
